# Patient Record
Sex: Male | Born: 1950 | Race: Black or African American | Hispanic: No | Marital: Single | Smoking: Former smoker
Health system: Southern US, Community
[De-identification: ages and names within clinical notes are randomized; demographics above are authoritative.]

## PROBLEM LIST (undated history)

## (undated) DIAGNOSIS — D649 Anemia, unspecified: Secondary | ICD-10-CM

## (undated) DIAGNOSIS — M1990 Unspecified osteoarthritis, unspecified site: Secondary | ICD-10-CM

## (undated) DIAGNOSIS — I509 Heart failure, unspecified: Secondary | ICD-10-CM

## (undated) DIAGNOSIS — I10 Essential (primary) hypertension: Secondary | ICD-10-CM

## (undated) DIAGNOSIS — I499 Cardiac arrhythmia, unspecified: Secondary | ICD-10-CM

## (undated) DIAGNOSIS — R0600 Dyspnea, unspecified: Secondary | ICD-10-CM

## (undated) DIAGNOSIS — N189 Chronic kidney disease, unspecified: Secondary | ICD-10-CM

## (undated) DIAGNOSIS — I4891 Unspecified atrial fibrillation: Secondary | ICD-10-CM

## (undated) HISTORY — PX: ANKLE SURGERY: SHX546

## (undated) HISTORY — PX: FOOT SURGERY: SHX648

## (undated) HISTORY — DX: Heart failure, unspecified: I50.9

## (undated) HISTORY — DX: Chronic kidney disease, unspecified: N18.9

## (undated) HISTORY — PX: OTHER SURGICAL HISTORY: SHX169

## (undated) HISTORY — DX: Unspecified atrial fibrillation: I48.91

## (undated) HISTORY — DX: Essential (primary) hypertension: I10

---

## 2019-08-18 HISTORY — PX: CARDIOVERSION: SHX1299

## 2019-10-17 DIAGNOSIS — I2699 Other pulmonary embolism without acute cor pulmonale: Secondary | ICD-10-CM

## 2019-10-17 HISTORY — DX: Other pulmonary embolism without acute cor pulmonale: I26.99

## 2019-12-22 ENCOUNTER — Ambulatory Visit: Payer: Self-pay | Admitting: Gastroenterology

## 2020-08-02 ENCOUNTER — Other Ambulatory Visit: Payer: Self-pay | Admitting: Student

## 2020-08-02 ENCOUNTER — Other Ambulatory Visit (HOSPITAL_COMMUNITY): Payer: Self-pay | Admitting: Student

## 2020-08-02 DIAGNOSIS — N183 Chronic kidney disease, stage 3 unspecified: Secondary | ICD-10-CM

## 2020-08-02 DIAGNOSIS — I10 Essential (primary) hypertension: Secondary | ICD-10-CM

## 2020-08-08 ENCOUNTER — Encounter (HOSPITAL_COMMUNITY): Payer: Self-pay

## 2020-08-08 ENCOUNTER — Ambulatory Visit (HOSPITAL_COMMUNITY): Payer: Medicare Other | Attending: Student

## 2021-03-06 ENCOUNTER — Encounter: Payer: Self-pay | Admitting: Internal Medicine

## 2021-03-13 ENCOUNTER — Other Ambulatory Visit: Payer: Self-pay | Admitting: Nephrology

## 2021-03-13 ENCOUNTER — Other Ambulatory Visit (HOSPITAL_COMMUNITY): Payer: Self-pay | Admitting: Nephrology

## 2021-03-13 DIAGNOSIS — N170 Acute kidney failure with tubular necrosis: Secondary | ICD-10-CM

## 2021-03-13 DIAGNOSIS — I129 Hypertensive chronic kidney disease with stage 1 through stage 4 chronic kidney disease, or unspecified chronic kidney disease: Secondary | ICD-10-CM

## 2021-03-13 DIAGNOSIS — D638 Anemia in other chronic diseases classified elsewhere: Secondary | ICD-10-CM

## 2021-03-13 DIAGNOSIS — N1831 Chronic kidney disease, stage 3a: Secondary | ICD-10-CM

## 2021-03-13 DIAGNOSIS — R809 Proteinuria, unspecified: Secondary | ICD-10-CM

## 2021-03-19 ENCOUNTER — Ambulatory Visit (HOSPITAL_COMMUNITY)
Admission: RE | Admit: 2021-03-19 | Discharge: 2021-03-19 | Disposition: A | Discharge status: Home / Self Care | Payer: Medicare Other | Source: Ambulatory Visit | Attending: Nephrology | Admitting: Nephrology

## 2021-03-19 ENCOUNTER — Other Ambulatory Visit: Payer: Self-pay

## 2021-03-19 DIAGNOSIS — R809 Proteinuria, unspecified: Secondary | ICD-10-CM | POA: Diagnosis present

## 2021-03-19 DIAGNOSIS — N170 Acute kidney failure with tubular necrosis: Secondary | ICD-10-CM | POA: Diagnosis present

## 2021-03-19 DIAGNOSIS — D638 Anemia in other chronic diseases classified elsewhere: Secondary | ICD-10-CM

## 2021-03-19 DIAGNOSIS — I129 Hypertensive chronic kidney disease with stage 1 through stage 4 chronic kidney disease, or unspecified chronic kidney disease: Secondary | ICD-10-CM | POA: Diagnosis present

## 2021-03-19 DIAGNOSIS — N1831 Chronic kidney disease, stage 3a: Secondary | ICD-10-CM

## 2021-05-12 ENCOUNTER — Encounter: Payer: Self-pay | Admitting: Radiology

## 2021-05-27 ENCOUNTER — Encounter: Payer: Self-pay | Admitting: Orthopaedic Surgery

## 2021-05-27 ENCOUNTER — Ambulatory Visit: Payer: Medicare Other

## 2021-05-27 ENCOUNTER — Other Ambulatory Visit: Payer: Self-pay

## 2021-05-27 ENCOUNTER — Ambulatory Visit (INDEPENDENT_AMBULATORY_CARE_PROVIDER_SITE_OTHER): Payer: Medicare Other | Admitting: Orthopaedic Surgery

## 2021-05-27 VITALS — BP 130/74 | HR 54 | Ht 68.0 in | Wt 163.5 lb

## 2021-05-27 DIAGNOSIS — G8929 Other chronic pain: Secondary | ICD-10-CM

## 2021-05-27 DIAGNOSIS — Z7901 Long term (current) use of anticoagulants: Secondary | ICD-10-CM

## 2021-05-27 DIAGNOSIS — M5441 Lumbago with sciatica, right side: Secondary | ICD-10-CM

## 2021-05-27 DIAGNOSIS — M5442 Lumbago with sciatica, left side: Secondary | ICD-10-CM

## 2021-05-27 NOTE — Progress Notes (Signed)
Subjective:    Patient ID: Mario [name], male    DOB: 1950-12-04, 70 y.o.   MRN: [id number]  HPI He has long history of lower back pain, bilateral hip and bilateral knee pain and deformity of right ankle and gait problems.  He uses a walker.  He is seen her from [facility].  I have X-rays of the hips, pelvis and knees but no notes.  I have reviewed the X-rays sent.  He says his lower back hurts with some radiation down both legs.  He has no trauma.  He has no redness.  He is not weak.  He has more pain in the mornings and when weather is rainy or cold.  He is gradually getting worse.  He lives alone and is accompanied by paramedic from [place].  He takes over the counter medicines. He is on Eliquis because of atrial fib. He has GERD. He cannot take NSAIDs.  X-rays show mild degenerative changes, no fractures.  He needs a walker to walk but can stand on his own.  He has deformity of ankle on the right for years he says.  I have independently reviewed and interpreted x-rays of this patient done at another site by another physician or qualified health professional.   Review of Systems  Constitutional:  Positive for activity change.  Respiratory:  Positive for shortness of breath.   Cardiovascular:  Positive for palpitations.  Endocrine: Positive for cold intolerance.  Musculoskeletal:  Positive for arthralgias, back pain, gait problem, joint swelling and myalgias.  Allergic/Immunologic: Positive for environmental allergies.  All other systems reviewed and are negative. For Review of Systems, all other systems reviewed and are negative.  The following is a summary of the past history medically, past history surgically, known current medicines, social history and family history.  This information is gathered electronically by the computer from prior information and documentation.  I review this each visit and have found including this information at this point in the  chart is beneficial and informative.   No past medical history on file.   Current Outpatient Medications on File Prior to Visit  Medication Sig Dispense Refill   amiodarone (PACERONE) 200 MG tablet Take 200 mg by mouth 2 (two) times daily.     amLODipine (NORVASC) 5 MG tablet Take 5 mg by mouth daily.     ELIQUIS 5 MG TABS tablet Take 5 mg by mouth 2 (two) times daily.     furosemide (LASIX) 20 MG tablet Take 1 tablet by mouth daily.     losartan (COZAAR) 100 MG tablet Take 1 tablet by mouth daily.     metoprolol succinate (TOPROL-XL) 25 MG 24 hr tablet Take 1 tablet by mouth daily.     pantoprazole (PROTONIX) 40 MG tablet Take 1 tablet by mouth daily.     No current facility-administered medications on file prior to visit.    Social History   Socioeconomic History   Marital status: Unknown    Spouse name: Not on file   Number of children: Not on file   Years of education: Not on file   Highest education level: Not on file  Occupational History   Not on file  Tobacco Use   Smoking status: Former    Types: Cigarettes   Smokeless tobacco: Former    Types: Chew  Substance and Sexual Activity   Alcohol use: Not on file   Drug use: Not on file   Sexual activity: Not on file  Other Topics Concern   Not on file  Social History Narrative   Not on file   Social Determinants of Health   Financial Resource Strain: Not on file  Food Insecurity: Not on file  Transportation Needs: Not on file  Physical Activity: Not on file  Stress: Not on file  Social Connections: Not on file  Intimate Partner Violence: Not on file    No family history on file.  BP 130/74   Pulse (!) 54   Ht 5\' 8"  (1.727 m)   Wt 163 lb 8 oz (74.2 kg)   BMI 24.86 kg/m   Body mass index is 24.86 kg/m.     Objective:   Physical Exam Vitals and nursing note reviewed. Exam conducted with a chaperone present.  Constitutional:      Appearance: He is well-developed.  HENT:     Head: Normocephalic  and atraumatic.  Eyes:     Conjunctiva/sclera: Conjunctivae normal.     Pupils: Pupils are equal, round, and reactive to light.  Cardiovascular:     Rate and Rhythm: Normal rate and regular rhythm.  Pulmonary:     Effort: Pulmonary effort is normal.  Abdominal:     Palpations: Abdomen is soft.  Musculoskeletal:       Arms:     Cervical back: Normal range of motion and neck supple.  Skin:    General: Skin is warm and dry.  Neurological:     Mental Status: He is alert and oriented to person, place, and time.     Cranial Nerves: No cranial nerve deficit.     Motor: No abnormal muscle tone.     Coordination: Coordination normal.     Deep Tendon Reflexes: Reflexes are normal and symmetric. Reflexes normal.  Psychiatric:        Behavior: Behavior normal.        Thought Content: Thought content normal.        Judgment: Judgment normal.   X-rays were done of the lumbar spine, reported separately.       Assessment & Plan:   Encounter Diagnoses  Name Primary?   Chronic midline low back pain with bilateral sciatica Yes   Anticoagulated    I will get MRI of the lumbar spine.  Return in three weeks.  He may need brace for ankle on the right but I want to get the MRI first.  Call if any problem.  Precautions discussed.  Electronically Signed , MD 10/11/20222:00 PM

## 2021-06-11 ENCOUNTER — Other Ambulatory Visit: Payer: Self-pay

## 2021-06-11 ENCOUNTER — Ambulatory Visit (HOSPITAL_COMMUNITY)
Admission: RE | Admit: 2021-06-11 | Discharge: 2021-06-11 | Disposition: A | Discharge status: Home / Self Care | Payer: Medicare Other | Source: Ambulatory Visit | Attending: Orthopaedic Surgery | Admitting: Orthopaedic Surgery

## 2021-06-11 DIAGNOSIS — M5442 Lumbago with sciatica, left side: Secondary | ICD-10-CM | POA: Diagnosis present

## 2021-06-11 DIAGNOSIS — M5441 Lumbago with sciatica, right side: Secondary | ICD-10-CM | POA: Diagnosis not present

## 2021-06-11 DIAGNOSIS — G8929 Other chronic pain: Secondary | ICD-10-CM | POA: Diagnosis present

## 2021-06-17 ENCOUNTER — Ambulatory Visit (INDEPENDENT_AMBULATORY_CARE_PROVIDER_SITE_OTHER): Payer: Medicare Other | Admitting: Orthopaedic Surgery

## 2021-06-17 ENCOUNTER — Encounter: Payer: Self-pay | Admitting: Orthopaedic Surgery

## 2021-06-17 ENCOUNTER — Other Ambulatory Visit: Payer: Self-pay

## 2021-06-17 DIAGNOSIS — G8929 Other chronic pain: Secondary | ICD-10-CM

## 2021-06-17 DIAGNOSIS — M5441 Lumbago with sciatica, right side: Secondary | ICD-10-CM

## 2021-06-17 DIAGNOSIS — M5442 Lumbago with sciatica, left side: Secondary | ICD-10-CM

## 2021-06-17 NOTE — Progress Notes (Signed)
My back still hurts  He has more back pain.  He went to get his MRI of the lumbar spine and it showed: IMPRESSION: 1. Diffuse lumbar spine spondylosis as described above most severe at L1-2 and L2-3. 2.  No acute osseous injury of the lumbar spine. 3. At L1-2 there is a broad-based disc bulge with a large left paracentral disc extrusion with mass effect on the intraspinal nerve roots. Mild bilateral facet arthropathy. Severe spinal stenosis. Moderate left and mild right foraminal stenosis. 4. At L2-3 there is a broad-based disc bulge with a large left paracentral disc extrusion with cephalad migration of disc material and mass effect upon the left intraspinal nerve roots. No right foraminal stenosis. Mild left foraminal stenosis. Severe spinal stenosis.  Fredia Beets explained the findings to him.  I will have the neurosurgeon see him.  Patient is agreeable.  Spine/Pelvis examination:  Inspection:  Overall, sacoiliac joint benign and hips nontender; without crepitus or defects.   Thoracic spine inspection: Alignment normal without kyphosis present   Lumbar spine inspection:  Alignment  with normal lumbar lordosis, without scoliosis apparent.   Thoracic spine palpation:  without tenderness of spinal processes   Lumbar spine palpation: without tenderness of lumbar area; without tightness of lumbar muscles    Range of Motion:   Lumbar flexion, forward flexion is normal without pain or tenderness    Lumbar extension is full without pain or tenderness   Left lateral bend is normal without pain or tenderness   Right lateral bend is normal without pain or tenderness   Straight leg raising is normal  Strength & tone: normal   Stability overall normal stability  I have independently reviewed the MRI.      Encounter Diagnosis  Name Primary?   Chronic midline low back pain with bilateral sciatica Yes   To neurosurgery.  Call if any problem.  Precautions  discussed.  Electronically Signed Darreld Mclean, MD 11/1/202210:28 AM

## 2021-07-01 ENCOUNTER — Encounter: Payer: Self-pay | Admitting: *Deleted

## 2021-07-01 ENCOUNTER — Telehealth: Payer: Self-pay | Admitting: Internal Medicine

## 2021-07-01 ENCOUNTER — Telehealth: Payer: Self-pay | Admitting: *Deleted

## 2021-07-01 ENCOUNTER — Other Ambulatory Visit (HOSPITAL_COMMUNITY): Payer: Self-pay | Admitting: Neurosurgery

## 2021-07-01 ENCOUNTER — Other Ambulatory Visit: Payer: Self-pay

## 2021-07-01 ENCOUNTER — Other Ambulatory Visit: Payer: Self-pay | Admitting: Neurosurgery

## 2021-07-01 ENCOUNTER — Ambulatory Visit (INDEPENDENT_AMBULATORY_CARE_PROVIDER_SITE_OTHER): Payer: Medicare Other | Admitting: Gastroenterology

## 2021-07-01 ENCOUNTER — Encounter: Payer: Self-pay | Admitting: Gastroenterology

## 2021-07-01 DIAGNOSIS — M4126 Other idiopathic scoliosis, lumbar region: Secondary | ICD-10-CM

## 2021-07-01 DIAGNOSIS — Z1211 Encounter for screening for malignant neoplasm of colon: Secondary | ICD-10-CM | POA: Insufficient documentation

## 2021-07-01 DIAGNOSIS — D649 Anemia, unspecified: Secondary | ICD-10-CM

## 2021-07-01 DIAGNOSIS — G959 Disease of spinal cord, unspecified: Secondary | ICD-10-CM

## 2021-07-01 MED ORDER — PEG 3350-KCL-NA BICARB-NACL 420 G PO SOLR: ORAL | 0 refills | Status: DC

## 2021-07-01 NOTE — Telephone Encounter (Signed)
[facility] and made aware he can take enema with him to appt.

## 2021-07-01 NOTE — Addendum Note (Signed)
Addended by: Armstead Peaks on: 07/01/2021 09:25 AM   Modules accepted: Orders

## 2021-07-01 NOTE — Patient Instructions (Signed)
We are arranging a colonoscopy and upper endoscopy with Dr. Marletta Lor in the near future.  You will need to stop Eliquis for 2 days before the procedure!  Further recommendations to follow!  It was a pleasure to see you today. I want to create trusting relationships with patients to provide genuine, compassionate, and quality care. I value your feedback. If you receive a survey regarding your visit,  I greatly appreciate you taking time to fill this out.   Gelene Mink, PhD, ANP-BC [facility]

## 2021-07-01 NOTE — H&P (View-Only) (Signed)
Primary Care Physician:  Mario Pali, FNP Referring Provider: Coral Ceo, FNP Primary Gastroenterologist:  Dr. Marletta [name]  Chief Complaint  Patient presents with   Colonoscopy    Never had tcs. No fhcrc    HPI:   Mario [name] is a 70 y.o. male presenting today at the request of Mario Ceo, FNP, for screening colonoscopy. He is on Eliquis for afib. No prior colonoscopy. No family history of colorectal cancer or polyps that he is aware. He is here with a caretaker, a community paramedic.   Patient denies any abdominal pain, N/V, weight loss, lack of appetite, dysphagia, change in bowel habits, constipation, or diarrhea. I do note his Hgb dropped to 7 range in March 2022. He reports a blood transfusion X2 earlier this year in [place]. Records not available. No overt GI bleeding. Patient believes he was heme positive.   Has been on a PPI at least a year. Patient denies any chronic GERD symptoms. Takes OTC meds for arthritis but unable to tell me what. States he was told he had a stomach ulcer in the past but no EGD.   Most recent Hgb 12.5 in Oct 2022. Ferritin 44 in July 2022.   Past Medical History:  Diagnosis Date   Atrial fibrillation (HCC)    CHF (congestive heart failure) (HCC)    CKD (chronic kidney disease)    HTN (hypertension)     Past Surgical History:  Procedure Laterality Date   none      Current Outpatient Medications  Medication Sig Dispense Refill   amiodarone (PACERONE) 200 MG tablet Take 200 mg by mouth 2 (two) times daily.     amLODipine (NORVASC) 5 MG tablet Take 5 mg by mouth daily.     ELIQUIS 5 MG TABS tablet Take 5 mg by mouth 2 (two) times daily.     furosemide (LASIX) 20 MG tablet Take 1 tablet by mouth daily.     losartan (COZAAR) 100 MG tablet Take 1 tablet by mouth daily.     metoprolol succinate (TOPROL-XL) 25 MG 24 hr tablet Take 1 tablet by mouth daily.     pantoprazole (PROTONIX) 40 MG tablet Take 1 tablet by mouth daily.     No  current facility-administered medications for this visit.    Allergies as of 07/01/2021   (No Known Allergies)    Family History  Problem Relation Age of Onset   Cancer Sister        unknown type   Colon cancer Neg Hx    Colon polyps Neg Hx     Social History   Socioeconomic History   Marital status: Unknown    Spouse name: Not on file   Number of children: Not on file   Years of education: Not on file   Highest education level: Not on file  Occupational History   Not on file  Tobacco Use   Smoking status: Former    Types: Cigarettes   Smokeless tobacco: Former    Types: Chew  Substance and Sexual Activity   Alcohol use: Not Currently   Drug use: Not Currently   Sexual activity: Not on file  Other Topics Concern   Not on file  Social History Narrative   Not on file   Social Determinants of Health   Financial Resource Strain: Not on file  Food Insecurity: Not on file  Transportation Needs: Not on file  Physical Activity: Not on file  Stress: Not on file  Social Connections:  Not on file  Intimate Partner Violence: Not on file    Review of Systems: Gen: Denies any fever, chills, fatigue, weight loss, lack of appetite.  CV: Denies chest pain, heart palpitations, peripheral edema, syncope.  Resp: Denies shortness of breath at rest or with exertion. Denies wheezing or cough.  GI: see HPI GU : Denies urinary burning, urinary frequency, urinary hesitancy MS: chronic joint pain, back pain, right deformed ankle, uses walker for ambulation Derm: Denies rash, itching, dry skin Psych: Denies depression, anxiety, memory loss, and confusion Heme: Denies bruising, bleeding, and enlarged lymph nodes.  Physical Exam: BP 122/72   Pulse 64   Temp (!) 96.8 F (36 C) (Temporal)   Ht 5\' 8"  (1.727 m)   Wt 158 lb 9.6 oz (71.9 kg)   BMI 24.12 kg/m  General:   Alert and oriented. Pleasant and cooperative. Well-nourished and well-developed.  Head:  Normocephalic and  atraumatic. Eyes:  Without icterus Ears:  Normal auditory acuity. Mouth:  edentulous Lungs:  Clear to auscultation bilaterally.  Heart:  S1, S2 present without murmurs appreciated.  Abdomen:  +BS, soft, non-tender and non-distended. No HSM noted. No guarding or rebound. No masses appreciated.  Rectal:  Deferred  Msk:  right ankle deformity, uses walker for assistance, able to ambulate without assistance Extremities:  Without edema. Neurologic:  Alert and  oriented x4 Psych:  Alert and cooperative. Normal mood and affect.  ASSESSMENT: Mario [name] is a 70 y.o. male presenting today at the request of 61, FNP, for initial screening colonoscopy. No family history of colorectal cancer or polyps.  Upon review of chart, he has anemia of chronic disease and an iron deficiency component. He does note that he received two blood transfusions earlier this year in [place], but no endoscopic evaluations were undertaken. Hgb on file down to 7 in March of this year and now most recently 12.5. He has no overt GI bleeding.  On Eliquis chronically for afib. In setting of anticoagulation, could have occult blood loss anywhere in GI tract. He also reports taking OTC agents for joint pain, but he is unable to clarify if acetaminophen or NSAIDs.    Needs colonoscopy/EGD in near future to evaluate GI tract. Anemia improved at this time and without any overt GI bleeding.  Will hold Eliquis X 48 hours prior. Continue PPI.   PLAN:  Proceed with colonoscopy/EGD by Dr. April  in near future: the risks, benefits, and alternatives have been discussed with the patient in detail. The patient states understanding and desires to proceed.   Hold Eliquis X 48 hours prior  Further recommendations to follow.  Mario Lor, PhD, ANP-BC [facility]

## 2021-07-01 NOTE — Telephone Encounter (Signed)
Pre-op scheduled for 12/1 at 11:30am. [facility] and she is aware of pre-op appt details.

## 2021-07-01 NOTE — Progress Notes (Signed)
Primary Care Physician:  Wilmon Pali, FNP Referring Provider: Coral Ceo, FNP Primary Gastroenterologist:  Dr. Marletta Lor  Chief Complaint  Patient presents with   Colonoscopy    Never had tcs. No fhcrc    HPI:   Mario [name] is a 70 y.o. male presenting today at the request of Coral Ceo, FNP, for screening colonoscopy. He is on Eliquis for afib. No prior colonoscopy. No family history of colorectal cancer or polyps that he is aware. He is here with a caretaker, a community paramedic.   Patient denies any abdominal pain, N/V, weight loss, lack of appetite, dysphagia, change in bowel habits, constipation, or diarrhea. I do note his Hgb dropped to 7 range in March 2022. He reports a blood transfusion X2 earlier this year in [place]. Records not available. No overt GI bleeding. Patient believes he was heme positive.   Has been on a PPI at least a year. Patient denies any chronic GERD symptoms. Takes OTC meds for arthritis but unable to tell me what. States he was told he had a stomach ulcer in the past but no EGD.   Most recent Hgb 12.5 in Oct 2022. Ferritin 44 in July 2022.   Past Medical History:  Diagnosis Date   Atrial fibrillation (HCC)    CHF (congestive heart failure) (HCC)    CKD (chronic kidney disease)    HTN (hypertension)     Past Surgical History:  Procedure Laterality Date   none      Current Outpatient Medications  Medication Sig Dispense Refill   amiodarone (PACERONE) 200 MG tablet Take 200 mg by mouth 2 (two) times daily.     amLODipine (NORVASC) 5 MG tablet Take 5 mg by mouth daily.     ELIQUIS 5 MG TABS tablet Take 5 mg by mouth 2 (two) times daily.     furosemide (LASIX) 20 MG tablet Take 1 tablet by mouth daily.     losartan (COZAAR) 100 MG tablet Take 1 tablet by mouth daily.     metoprolol succinate (TOPROL-XL) 25 MG 24 hr tablet Take 1 tablet by mouth daily.     pantoprazole (PROTONIX) 40 MG tablet Take 1 tablet by mouth daily.     No  current facility-administered medications for this visit.    Allergies as of 07/01/2021   (No Known Allergies)    Family History  Problem Relation Age of Onset   Cancer Sister        unknown type   Colon cancer Neg Hx    Colon polyps Neg Hx     Social History   Socioeconomic History   Marital status: Unknown    Spouse name: Not on file   Number of children: Not on file   Years of education: Not on file   Highest education level: Not on file  Occupational History   Not on file  Tobacco Use   Smoking status: Former    Types: Cigarettes   Smokeless tobacco: Former    Types: Chew  Substance and Sexual Activity   Alcohol use: Not Currently   Drug use: Not Currently   Sexual activity: Not on file  Other Topics Concern   Not on file  Social History Narrative   Not on file   Social Determinants of Health   Financial Resource Strain: Not on file  Food Insecurity: Not on file  Transportation Needs: Not on file  Physical Activity: Not on file  Stress: Not on file  Social Connections:  Not on file  Intimate Partner Violence: Not on file    Review of Systems: Gen: Denies any fever, chills, fatigue, weight loss, lack of appetite.  CV: Denies chest pain, heart palpitations, peripheral edema, syncope.  Resp: Denies shortness of breath at rest or with exertion. Denies wheezing or cough.  GI: see HPI GU : Denies urinary burning, urinary frequency, urinary hesitancy MS: chronic joint pain, back pain, right deformed ankle, uses walker for ambulation Derm: Denies rash, itching, dry skin Psych: Denies depression, anxiety, memory loss, and confusion Heme: Denies bruising, bleeding, and enlarged lymph nodes.  Physical Exam: BP 122/72   Pulse 64   Temp (!) 96.8 F (36 C) (Temporal)   Ht 5\' 8"  (1.727 m)   Wt 158 lb 9.6 oz (71.9 kg)   BMI 24.12 kg/m  General:   Alert and oriented. Pleasant and cooperative. Well-nourished and well-developed.  Head:  Normocephalic and  atraumatic. Eyes:  Without icterus Ears:  Normal auditory acuity. Mouth:  edentulous Lungs:  Clear to auscultation bilaterally.  Heart:  S1, S2 present without murmurs appreciated.  Abdomen:  +BS, soft, non-tender and non-distended. No HSM noted. No guarding or rebound. No masses appreciated.  Rectal:  Deferred  Msk:  right ankle deformity, uses walker for assistance, able to ambulate without assistance Extremities:  Without edema. Neurologic:  Alert and  oriented x4 Psych:  Alert and cooperative. Normal mood and affect.  ASSESSMENT: Mario [name] is a 70 y.o. male presenting today at the request of 61, FNP, for initial screening colonoscopy. No family history of colorectal cancer or polyps.  Upon review of chart, he has anemia of chronic disease and an iron deficiency component. He does note that he received two blood transfusions earlier this year in [place], but no endoscopic evaluations were undertaken. Hgb on file down to 7 in March of this year and now most recently 12.5. He has no overt GI bleeding.  On Eliquis chronically for afib. In setting of anticoagulation, could have occult blood loss anywhere in GI tract. He also reports taking OTC agents for joint pain, but he is unable to clarify if acetaminophen or NSAIDs.    Needs colonoscopy/EGD in near future to evaluate GI tract. Anemia improved at this time and without any overt GI bleeding.  Will hold Eliquis X 48 hours prior. Continue PPI.   PLAN:  Proceed with colonoscopy/EGD by Dr. April  in near future: the risks, benefits, and alternatives have been discussed with the patient in detail. The patient states understanding and desires to proceed.   Hold Eliquis X 48 hours prior  Further recommendations to follow.  Marletta Lor, PhD, ANP-BC [facility]

## 2021-07-01 NOTE — Telephone Encounter (Signed)
Mario [name] [phone] PLEASE CALL. PATIENT WILL NOT BE ABLE TO DO AN ENEMA

## 2021-07-15 ENCOUNTER — Encounter (HOSPITAL_COMMUNITY): Payer: Self-pay

## 2021-07-15 ENCOUNTER — Other Ambulatory Visit: Payer: Self-pay | Admitting: Neurosurgery

## 2021-07-15 ENCOUNTER — Other Ambulatory Visit: Payer: Self-pay

## 2021-07-15 ENCOUNTER — Ambulatory Visit (HOSPITAL_COMMUNITY)
Admission: RE | Admit: 2021-07-15 | Discharge: 2021-07-15 | Disposition: A | Discharge status: Home / Self Care | Payer: Medicare Other | Source: Ambulatory Visit | Attending: Neurosurgery | Admitting: Neurosurgery

## 2021-07-15 ENCOUNTER — Other Ambulatory Visit (HOSPITAL_COMMUNITY): Payer: Self-pay | Admitting: Neurosurgery

## 2021-07-15 DIAGNOSIS — M4126 Other idiopathic scoliosis, lumbar region: Secondary | ICD-10-CM

## 2021-07-15 DIAGNOSIS — G959 Disease of spinal cord, unspecified: Secondary | ICD-10-CM | POA: Insufficient documentation

## 2021-07-17 ENCOUNTER — Encounter (HOSPITAL_COMMUNITY): Payer: Self-pay

## 2021-07-17 ENCOUNTER — Other Ambulatory Visit: Payer: Self-pay

## 2021-07-17 ENCOUNTER — Encounter (HOSPITAL_COMMUNITY)
Admission: RE | Admit: 2021-07-17 | Discharge: 2021-07-17 | Disposition: A | Discharge status: Home / Self Care | Payer: Medicare Other | Source: Ambulatory Visit | Attending: Internal Medicine | Admitting: Internal Medicine

## 2021-07-17 VITALS — BP 139/80 | HR 57 | Temp 97.8°F | Resp 18 | Ht 68.0 in | Wt 158.0 lb

## 2021-07-17 DIAGNOSIS — Z01818 Encounter for other preprocedural examination: Secondary | ICD-10-CM | POA: Insufficient documentation

## 2021-07-17 DIAGNOSIS — N189 Chronic kidney disease, unspecified: Secondary | ICD-10-CM | POA: Diagnosis not present

## 2021-07-17 DIAGNOSIS — D649 Anemia, unspecified: Secondary | ICD-10-CM | POA: Diagnosis not present

## 2021-07-17 HISTORY — DX: Unspecified osteoarthritis, unspecified site: M19.90

## 2021-07-17 HISTORY — DX: Cardiac arrhythmia, unspecified: I49.9

## 2021-07-17 HISTORY — DX: Anemia, unspecified: D64.9

## 2021-07-17 LAB — BASIC METABOLIC PANEL
Anion gap: 8 (ref 5–15)
BUN: 20 mg/dL (ref 8–23)
CO2: 24 mmol/L (ref 22–32)
Calcium: 9 mg/dL (ref 8.9–10.3)
Chloride: 109 mmol/L (ref 98–111)
Creatinine, Ser: 1.41 mg/dL — ABNORMAL HIGH (ref 0.61–1.24)
GFR, Estimated: 53 mL/min — ABNORMAL LOW (ref >60)
Glucose, Bld: 96 mg/dL (ref 70–99)
Potassium: 4 mmol/L (ref 3.5–5.1)
Sodium: 141 mmol/L (ref 135–145)

## 2021-07-17 LAB — CBC WITH DIFFERENTIAL/PLATELET
Abs Immature Granulocytes: 0.03 10*3/uL (ref 0.00–0.07)
Basophils Absolute: 0 10*3/uL (ref 0.0–0.1)
Basophils Relative: 1 %
Eosinophils Absolute: 0 10*3/uL (ref 0.0–0.5)
Eosinophils Relative: 1 %
HCT: 34.2 % — ABNORMAL LOW (ref 39.0–52.0)
Hemoglobin: 11 g/dL — ABNORMAL LOW (ref 13.0–17.0)
Immature Granulocytes: 1 %
Lymphocytes Relative: 9 %
Lymphs Abs: 0.6 10*3/uL — ABNORMAL LOW (ref 0.7–4.0)
MCH: 27.4 pg (ref 26.0–34.0)
MCHC: 32.2 g/dL (ref 30.0–36.0)
MCV: 85.1 fL (ref 80.0–100.0)
Monocytes Absolute: 0.6 10*3/uL (ref 0.1–1.0)
Monocytes Relative: 8 %
Neutro Abs: 5.4 10*3/uL (ref 1.7–7.7)
Neutrophils Relative %: 80 %
Platelets: 320 10*3/uL (ref 150–400)
RBC: 4.02 MIL/uL — ABNORMAL LOW (ref 4.22–5.81)
RDW: 14.4 % (ref 11.5–15.5)
WBC: 6.6 10*3/uL (ref 4.0–10.5)
nRBC: 0 % (ref 0.0–0.2)

## 2021-07-17 NOTE — Patient Instructions (Signed)
Mario [name]  07/17/2021     @PREFPERIOPPHARMACY @   Your procedure is scheduled on  07/21/2021.   Report to 14/12/2020 at  0730 A.M.   Call this number if you have problems the morning of surgery:  [phone]   Remember:  Follow the diet and prep instructions given to you by the office.    You last dose of eliquis should be 07/18/2021.    Take these medicines the morning of surgery with A SIP OF WATER       amiodarone, amlodipine, proscar, metoprolol, protonix, flomax.    Do not wear jewelry, make-up or nail polish.  Do not wear lotions, powders, or perfumes, or deodorant.  Do not shave 48 hours prior to surgery.  Men may shave face and neck.  Do not bring valuables to the hospital.  [facility] is not responsible for any belongings or valuables.  Contacts, dentures or bridgework may not be worn into surgery.  Leave your suitcase in the car.  After surgery it may be brought to your room.  For patients admitted to the hospital, discharge time will be determined by your treatment team.  Patients discharged the day of surgery will not be allowed to drive home and must have someone with them for 24 hours.    Special instructions:   DO NOT smoke tobacco or vape for 24 hours before your procedure.  Please read over the following fact sheets that you were given. Anesthesia Post-op Instructions and Care and Recovery After Surgery      Upper Endoscopy, Adult, Care After This sheet gives you information about how to care for yourself after your procedure. Your health care provider may also give you more specific instructions. If you have problems or questions, contact your health care provider. What can I expect after the procedure? After the procedure, it is common to have: A sore throat. Mild stomach pain or discomfort. Bloating. Nausea. Follow these instructions at home:  Follow instructions from your health care provider about what to eat or drink  after your procedure. Return to your normal activities as told by your health care provider. Ask your health care provider what activities are safe for you. Take over-the-counter and prescription medicines only as told by your health care provider. If you were given a sedative during the procedure, it can affect you for several hours. Do not drive or operate machinery until your health care provider says that it is safe. Keep all follow-up visits as told by your health care provider. This is important. Contact a health care provider if you have: A sore throat that lasts longer than one day. Trouble swallowing. Get help right away if: You vomit blood or your vomit looks like coffee grounds. You have: A fever. Bloody, black, or tarry stools. A severe sore throat or you cannot swallow. Difficulty breathing. Severe pain in your chest or abdomen. Summary After the procedure, it is common to have a sore throat, mild stomach discomfort, bloating, and nausea. If you were given a sedative during the procedure, it can affect you for several hours. Do not drive or operate machinery until your health care provider says that it is safe. Follow instructions from your health care provider about what to eat or drink after your procedure. Return to your normal activities as told by your health care provider. This information is not intended to replace advice given to you by your health care provider. Make sure  you discuss any questions you have with your health care provider. Document Revised: 06/09/2019 Document Reviewed: 01/03/2018 Elsevier Patient Education  2022 [employer]. Colonoscopy, Adult, Care After This sheet gives you information about how to care for yourself after your procedure. Your health care provider may also give you more specific instructions. If you have problems or questions, contact your health care provider. What can I expect after the procedure? After the procedure, it is common  to have: A small amount of blood in your stool for 24 hours after the procedure. Some gas. Mild cramping or bloating of your abdomen. Follow these instructions at home: Eating and drinking  Drink enough fluid to keep your urine pale yellow. Follow instructions from your health care provider about eating or drinking restrictions. Resume your normal diet as instructed by your health care provider. Avoid heavy or fried foods that are hard to digest. Activity Rest as told by your health care provider. Avoid sitting for a long time without moving. Get up to take short walks every 1-2 hours. This is important to improve blood flow and breathing. Ask for help if you feel weak or unsteady. Return to your normal activities as told by your health care provider. Ask your health care provider what activities are safe for you. Managing cramping and bloating  Try walking around when you have cramps or feel bloated. Apply heat to your abdomen as told by your health care provider. Use the heat source that your health care provider recommends, such as a moist heat pack or a heating pad. Place a towel between your skin and the heat source. Leave the heat on for 20-30 minutes. Remove the heat if your skin turns bright red. This is especially important if you are unable to feel pain, heat, or cold. You may have a greater risk of getting burned. General instructions If you were given a sedative during the procedure, it can affect you for several hours. Do not drive or operate machinery until your health care provider says that it is safe. For the first 24 hours after the procedure: Do not sign important documents. Do not drink alcohol. Do your regular daily activities at a slower pace than normal. Eat soft foods that are easy to digest. Take over-the-counter and prescription medicines only as told by your health care provider. Keep all follow-up visits as told by your health care provider. This is  important. Contact a health care provider if: You have blood in your stool 2-3 days after the procedure. Get help right away if you have: More than a small spotting of blood in your stool. Large blood clots in your stool. Swelling of your abdomen. Nausea or vomiting. A fever. Increasing pain in your abdomen that is not relieved with medicine. Summary After the procedure, it is common to have a small amount of blood in your stool. You may also have mild cramping and bloating of your abdomen. If you were given a sedative during the procedure, it can affect you for several hours. Do not drive or operate machinery until your health care provider says that it is safe. Get help right away if you have a lot of blood in your stool, nausea or vomiting, a fever, or increased pain in your abdomen. This information is not intended to replace advice given to you by your health care provider. Make sure you discuss any questions you have with your health care provider. Document Revised: 06/09/2019 Document Reviewed: 02/27/2019 Elsevier Patient Education  2022  Else[employer]. Monitored Anesthesia Care, Care After This sheet gives you information about how to care for yourself after your procedure. Your health care provider may also give you more specific instructions. If you have problems or questions, contact your health care provider. What can I expect after the procedure? After the procedure, it is common to have: Tiredness. Forgetfulness about what happened after the procedure. Impaired judgment for important decisions. Nausea or vomiting. Some difficulty with balance. Follow these instructions at home: For the time period you were told by your health care provider:   Rest as needed. Do not participate in activities where you could fall or become injured. Do not drive or use machinery. Do not drink alcohol. Do not take sleeping pills or medicines that cause drowsiness. Do not make important  decisions or sign legal documents. Do not take care of children on your own. Eating and drinking Follow the diet that is recommended by your health care provider. Drink enough fluid to keep your urine pale yellow. If you vomit: Drink water, juice, or soup when you can drink without vomiting. Make sure you have little or no nausea before eating solid foods. General instructions Have a responsible adult stay with you for the time you are told. It is important to have someone help care for you until you are awake and alert. Take over-the-counter and prescription medicines only as told by your health care provider. If you have sleep apnea, surgery and certain medicines can increase your risk for breathing problems. Follow instructions from your health care provider about wearing your sleep device: Anytime you are sleeping, including during daytime naps. While taking prescription pain medicines, sleeping medicines, or medicines that make you drowsy. Avoid smoking. Keep all follow-up visits as told by your health care provider. This is important. Contact a health care provider if: You keep feeling nauseous or you keep vomiting. You feel light-headed. You are still sleepy or having trouble with balance after 24 hours. You develop a rash. You have a fever. You have redness or swelling around the IV site. Get help right away if: You have trouble breathing. You have new-onset confusion at home. Summary For several hours after your procedure, you may feel tired. You may also be forgetful and have poor judgment. Have a responsible adult stay with you for the time you are told. It is important to have someone help care for you until you are awake and alert. Rest as told. Do not drive or operate machinery. Do not drink alcohol or take sleeping pills. Get help right away if you have trouble breathing, or if you suddenly become confused. This information is not intended to replace advice given to you  by your health care provider. Make sure you discuss any questions you have with your health care provider. Document Revised: 04/18/2020 Document Reviewed: 07/06/2019 Elsevier Patient Education  2022 [employer].

## 2021-07-21 ENCOUNTER — Encounter (HOSPITAL_COMMUNITY)
Admission: RE | Disposition: A | Discharge status: Home / Self Care | Payer: Self-pay | Source: Home / Self Care | Attending: Internal Medicine

## 2021-07-21 ENCOUNTER — Encounter (HOSPITAL_COMMUNITY): Payer: Self-pay

## 2021-07-21 ENCOUNTER — Ambulatory Visit (HOSPITAL_COMMUNITY): Payer: Medicare Other | Admitting: Anesthesiology

## 2021-07-21 ENCOUNTER — Encounter: Payer: Self-pay | Admitting: Internal Medicine

## 2021-07-21 ENCOUNTER — Telehealth: Payer: Self-pay | Admitting: Internal Medicine

## 2021-07-21 ENCOUNTER — Ambulatory Visit (HOSPITAL_COMMUNITY)
Admission: RE | Admit: 2021-07-21 | Discharge: 2021-07-21 | Disposition: A | Discharge status: Home / Self Care | Payer: Medicare Other | Attending: Internal Medicine | Admitting: Internal Medicine

## 2021-07-21 DIAGNOSIS — I4891 Unspecified atrial fibrillation: Secondary | ICD-10-CM | POA: Diagnosis not present

## 2021-07-21 DIAGNOSIS — K648 Other hemorrhoids: Secondary | ICD-10-CM | POA: Diagnosis not present

## 2021-07-21 DIAGNOSIS — K317 Polyp of stomach and duodenum: Secondary | ICD-10-CM

## 2021-07-21 DIAGNOSIS — Z538 Procedure and treatment not carried out for other reasons: Secondary | ICD-10-CM | POA: Diagnosis not present

## 2021-07-21 DIAGNOSIS — D509 Iron deficiency anemia, unspecified: Secondary | ICD-10-CM | POA: Diagnosis present

## 2021-07-21 DIAGNOSIS — Z7901 Long term (current) use of anticoagulants: Secondary | ICD-10-CM | POA: Insufficient documentation

## 2021-07-21 DIAGNOSIS — Z87891 Personal history of nicotine dependence: Secondary | ICD-10-CM | POA: Insufficient documentation

## 2021-07-21 HISTORY — PX: COLONOSCOPY WITH PROPOFOL: SHX5780

## 2021-07-21 HISTORY — PX: ESOPHAGOGASTRODUODENOSCOPY (EGD) WITH PROPOFOL: SHX5813

## 2021-07-21 HISTORY — PX: POLYPECTOMY: SHX5525

## 2021-07-21 SURGERY — COLONOSCOPY WITH PROPOFOL
Anesthesia: General

## 2021-07-21 MED ORDER — PROPOFOL 500 MG/50ML IV EMUL
INTRAVENOUS | Status: DC | PRN
  Administered 2021-07-21: 150 ug/kg/min via INTRAVENOUS

## 2021-07-21 MED ORDER — LIDOCAINE HCL (CARDIAC) PF 100 MG/5ML IV SOSY
PREFILLED_SYRINGE | INTRAVENOUS | Status: DC | PRN
  Administered 2021-07-21: 50 mg via INTRAVENOUS

## 2021-07-21 MED ORDER — LACTATED RINGERS IV SOLN: INTRAVENOUS | Status: DC

## 2021-07-21 MED ORDER — PROPOFOL 10 MG/ML IV BOLUS
INTRAVENOUS | Status: DC | PRN
  Administered 2021-07-21: 100 mg via INTRAVENOUS

## 2021-07-21 NOTE — Telephone Encounter (Signed)
Good morning, just completed colonoscopy on this patient.  He was not adequately prepped today.  He will need repeat colonoscopy in 3 to 6 months with extended prep and 2 days of clears.  EGD completed today.  Can we set this up?  Thank you

## 2021-07-21 NOTE — Anesthesia Postprocedure Evaluation (Signed)
Anesthesia Post Note  Patient: Odel Schmid  Procedure(s) Performed: COLONOSCOPY WITH PROPOFOL ESOPHAGOGASTRODUODENOSCOPY (EGD) WITH PROPOFOL POLYPECTOMY  Patient location during evaluation: PACU Anesthesia Type: General Level of consciousness: awake and alert Pain management: pain level controlled Vital Signs Assessment: post-procedure vital signs reviewed and stable Respiratory status: spontaneous breathing, nonlabored ventilation and respiratory function stable Cardiovascular status: blood pressure returned to baseline and stable Postop Assessment: no apparent nausea or vomiting Anesthetic complications: no   No notable events documented.   Last Vitals:  Vitals:   07/21/21 0734 07/21/21 0852  BP: (!) 144/84 124/77  Pulse: (!) 53 (!) 50  Resp: 16 12  Temp: 36.9 C 36.7 C  SpO2: 98% 100%    Last Pain:  Vitals:   07/21/21 0852  TempSrc: Axillary  PainSc: 0-No pain                 Kallista Pae C Loreen Bankson

## 2021-07-21 NOTE — Interval H&P Note (Signed)
History and Physical Interval Note:  07/21/2021 8:16 AM  Mario [name]  has presented today for surgery, with the diagnosis of anemia.  The various methods of treatment have been discussed with the patient and family. After consideration of risks, benefits and other options for treatment, the patient has consented to  Procedure(s) with comments: COLONOSCOPY WITH PROPOFOL (N/A) - 9:30am ESOPHAGOGASTRODUODENOSCOPY (EGD) WITH PROPOFOL (N/A) as a surgical intervention.  The patient's history has been reviewed, patient examined, no change in status, stable for surgery.  I have reviewed the patient's chart and labs.  Questions were answered to the patient's satisfaction.     Lanelle Bal

## 2021-07-21 NOTE — Anesthesia Preprocedure Evaluation (Addendum)
Anesthesia Evaluation  Patient identified by MRN, date of birth, ID band Patient awake    Reviewed: Allergy & Precautions, NPO status , Patient's Chart, lab work & pertinent test results  Airway Mallampati: II  TM Distance: >3 FB Neck ROM: Full    Dental  (+) Edentulous Upper, Edentulous Lower, Dental Advisory Given   Pulmonary former smoker,    Pulmonary exam normal breath sounds clear to auscultation       Cardiovascular Exercise Tolerance: Good hypertension, Pt. on medications +CHF  + dysrhythmias Atrial Fibrillation  Rhythm:Regular Rate:Bradycardia  17-Jul-2021 12:02:05 [facility]-AP-300 ROUTINE RECORD 02-14-1951 (70 yr) Male Black Vent. rate 56 BPM PR interval 154 ms QRS duration 80 ms QT/QTcB 466/449 ms P-R-T axes 70 55 65 Sinus bradycardia Nonspecific T wave abnormality Abnormal ECG Confirmed by Carylon Perches [phone]) on 07/18/2021 5:48:17 PM   Neuro/Psych negative neurological ROS  negative psych ROS   GI/Hepatic negative GI ROS, Neg liver ROS,   Endo/Other    Renal/GU Renal InsufficiencyRenal disease     Musculoskeletal  (+) Arthritis , Osteoarthritis,    Abdominal   Peds  Hematology  (+) anemia ,   Anesthesia Other Findings   Reproductive/Obstetrics                           Anesthesia Physical Anesthesia Plan  ASA: 3  Anesthesia Plan: General   Post-op Pain Management: Minimal or no pain anticipated   Induction: Intravenous  PONV Risk Score and Plan: TIVA  Airway Management Planned: Nasal Cannula and Natural Airway  Additional Equipment:   Intra-op Plan:   Post-operative Plan:   Informed Consent: I have reviewed the patients History and Physical, chart, labs and discussed the procedure including the risks, benefits and alternatives for the proposed anesthesia with the patient or authorized representative who has indicated his/her understanding and  acceptance.       Plan Discussed with: CRNA and Surgeon  Anesthesia Plan Comments:        Anesthesia Quick Evaluation

## 2021-07-21 NOTE — Op Note (Signed)
[facility] Patient Name: Mario [name] Procedure Date: 07/21/2021 8:38 AM MRN: [id number] Date of Birth: September 16, 1950 Attending MD: Elon Alas. Abbey Chatters DO CSN: [id number] Age: 70 Admit Type: Outpatient Procedure:                Colonoscopy Indications:              Iron deficiency anemia Providers:                Elon Alas. Abbey Chatters, DO, Charlsie Quest. Theda Sers RN, RN,                            Nelma Rothman, Technician Referring MD:              Medicines:                See the Anesthesia note for documentation of the                            administered medications Complications:            No immediate complications. Estimated Blood Loss:     Estimated blood loss: none. Procedure:                Pre-Anesthesia Assessment:                           - The anesthesia plan was to use monitored                            anesthesia care (MAC).                           After obtaining informed consent, the colonoscope                            was passed under direct vision. Throughout the                            procedure, the patient's blood pressure, pulse, and                            oxygen saturations were monitored continuously. The                            [id number]-HQ190L ([id number]) scope was introduced through                            the anus with the intention of advancing to the                            cecum. The scope was advanced to the ascending                            colon before the procedure was aborted. Medications                            were given. The colonoscopy was  performed without                            difficulty. The patient tolerated the procedure                            well. The quality of the bowel preparation was                            evaluated using the BBPS [facility] Bowel Preparation                            Scale) with scores of: Right Colon = 0 (unprepared,                            mucosa not seen due to solid stool  that cannot be                            cleared or unseen proximal colon segment in a                            colonoscopy aborted due to inadequate bowel prep),                            Transverse Colon = 1 (portion of mucosa seen, but                            other areas not well seen due to staining, residual                            stool and/or opaque liquid) and Left Colon = 2                            (minor amount of residual staining, small fragments                            of stool and/or opaque liquid, but mucosa seen                            well). The total BBPS score equals 3. The quality                            of the bowel preparation was inadequate. Scope In: 8:40:20 AM Scope Out: 8:46:21 AM Total Procedure Duration: 0 hours 6 minutes 1 second  Findings:      The perianal and digital rectal examinations were normal.      Non-bleeding internal hemorrhoids were found during endoscopy.      Extensive amounts of solid stool was found in the transverse colon and       in the ascending colon, precluding visualization. Impression:               - Preparation of the colon was inadequate.                           -  Non-bleeding internal hemorrhoids.                           - Stool in the transverse colon and in the                            ascending colon.                           - No specimens collected. Moderate Sedation:      Per Anesthesia Care Recommendation:           - Patient has a contact number available for                            emergencies. The signs and symptoms of potential                            delayed complications were discussed with the                            patient. Return to normal activities tomorrow.                            Written discharge instructions were provided to the                            patient.                           - Resume previous diet.                           - Continue present  medications.                           - Repeat colonoscopy in 3-6 months because the                            bowel preparation was poor. Will need extended prep                            and clear liquids x2 days                           - Return to GI clinic after studies are complete. Procedure Code(s):        --- Professional ---                           [phone], 67, Colonoscopy, flexible; diagnostic,                            including collection of specimen(s) by brushing or                            washing, when performed (separate procedure) Diagnosis Code(s):        ---  Professional ---                           K64.8, Other hemorrhoids                           D50.9, Iron deficiency anemia, unspecified CPT copyright 2019 American Medical Association. All rights reserved. The codes documented in this report are preliminary and upon coder review may  be revised to meet current compliance requirements. Elon Alas. Abbey Chatters, DO [place] Abbey Chatters, DO 07/21/2021 8:49:22 AM This report has been signed electronically. Number of Addenda: 0

## 2021-07-21 NOTE — Op Note (Signed)
[facility] Patient Name: Mario [name] Procedure Date: 07/21/2021 8:14 AM MRN: [id number] Date of Birth: 22-Sep-1950 Attending MD: Elon Alas. Abbey Chatters DO CSN: [id number] Age: 70 Admit Type: Outpatient Procedure:                Upper GI endoscopy Indications:              Iron deficiency anemia Providers:                Elon Alas. Abbey Chatters, DO, Charlsie Quest. Theda Sers RN, RN,                            Nelma Rothman, Technician Referring MD:              Medicines:                See the Anesthesia note for documentation of the                            administered medications Complications:            No immediate complications. Estimated Blood Loss:     Estimated blood loss was minimal. Procedure:                Pre-Anesthesia Assessment:                           - The anesthesia plan was to use monitored                            anesthesia care (MAC).                           After obtaining informed consent, the endoscope was                            passed under direct vision. Throughout the                            procedure, the patient's blood pressure, pulse, and                            oxygen saturations were monitored continuously. The                            [id number]-H190 ([id number]) scope was introduced through the                            mouth, and advanced to the second part of duodenum.                            The upper GI endoscopy was accomplished without                            difficulty. The patient tolerated the procedure                            well. Scope In:  8:29:23 AM Scope Out: 8:34:58 AM Total Procedure Duration: 0 hours 5 minutes 35 seconds  Findings:      There is no endoscopic evidence of bleeding, areas of erosion,       inflammation, ulcerations or varices in the entire esophagus.      One 4 mm sessile polyp with no bleeding and no stigmata of recent       bleeding was found in the gastric antrum. The polyp was removed with a        cold snare. Resection and retrieval were complete.      The duodenal bulb, first portion of the duodenum and second portion of       the duodenum were normal. Impression:               - One gastric polyp. Resected and retrieved.                           - Normal duodenal bulb, first portion of the                            duodenum and second portion of the duodenum. Moderate Sedation:      Per Anesthesia Care Recommendation:           - Patient has a contact number available for                            emergencies. The signs and symptoms of potential                            delayed complications were discussed with the                            patient. Return to normal activities tomorrow.                            Written discharge instructions were provided to the                            patient.                           - Resume previous diet.                           - Continue present medications.                           - Await pathology results.                           - Use Protonix (pantoprazole) 40 mg PO daily. Procedure Code(s):        --- Professional ---                           [phone], Esophagogastroduodenoscopy, flexible,                            transoral; with removal  of tumor(s), polyp(s), or                            other lesion(s) by snare technique Diagnosis Code(s):        --- Professional ---                           K31.7, Polyp of stomach and duodenum                           D50.9, Iron deficiency anemia, unspecified CPT copyright 2019 American Medical Association. All rights reserved. The codes documented in this report are preliminary and upon coder review may  be revised to meet current compliance requirements. Elon Alas. Abbey Chatters, DO [place] Abbey Chatters, DO 07/21/2021 8:37:46 AM This report has been signed electronically. Number of Addenda: 0

## 2021-07-21 NOTE — Progress Notes (Signed)
PT waiting on public transportation with caregiver at bedside

## 2021-07-21 NOTE — Transfer of Care (Signed)
Immediate Anesthesia Transfer of Care Note  Patient: Mario [name]  Procedure(s) Performed: COLONOSCOPY WITH PROPOFOL ESOPHAGOGASTRODUODENOSCOPY (EGD) WITH PROPOFOL POLYPECTOMY  Patient Location: Short Stay  Anesthesia Type:General  Level of Consciousness: drowsy  Airway & Oxygen Therapy: Patient Spontanous Breathing  Post-op Assessment: Report given to RN and Post -op Vital signs reviewed and stable  Post vital signs: Reviewed and stable  Last Vitals:  Vitals Value Taken Time  BP    Temp    Pulse    Resp    SpO2      Last Pain:  Vitals:   07/21/21 0825  TempSrc:   PainSc: 0-No pain         Complications: No notable events documented.

## 2021-07-21 NOTE — Telephone Encounter (Signed)
Stacey please schedule OV in 3-6 months to reschedule colonoscopy d/t inadequate prep.

## 2021-07-21 NOTE — Discharge Instructions (Addendum)
EGD Discharge instructions Please read the instructions outlined below and refer to this sheet in the next few weeks. These discharge instructions provide you with general information on caring for yourself after you leave the hospital. Your doctor may also give you specific instructions. While your treatment has been planned according to the most current medical practices available, unavoidable complications occasionally occur. If you have any problems or questions after discharge, please call your doctor. ACTIVITY You may resume your regular activity but move at a slower pace for the next 24 hours.  Take frequent rest periods for the next 24 hours.  Walking will help expel (get rid of) the air and reduce the bloated feeling in your abdomen.  No driving for 24 hours (because of the anesthesia (medicine) used during the test).  You may shower.  Do not sign any important legal documents or operate any machinery for 24 hours (because of the anesthesia used during the test).  NUTRITION Drink plenty of fluids.  You may resume your normal diet.  Begin with a light meal and progress to your normal diet.  Avoid alcoholic beverages for 24 hours or as instructed by your caregiver.  MEDICATIONS You may resume your normal medications unless your caregiver tells you otherwise.  WHAT YOU CAN EXPECT TODAY You may experience abdominal discomfort such as a feeling of fullness or "gas" pains.  FOLLOW-UP Your doctor will discuss the results of your test with you.  SEEK IMMEDIATE MEDICAL ATTENTION IF ANY OF THE FOLLOWING OCCUR: Excessive nausea (feeling sick to your stomach) and/or vomiting.  Severe abdominal pain and distention (swelling).  Trouble swallowing.  Temperature over 101 F (37.8 C).  Rectal bleeding or vomiting of blood.    Colonoscopy Discharge Instructions  Read the instructions outlined below and refer to this sheet in the next few weeks. These discharge instructions provide you with  general information on caring for yourself after you leave the hospital. Your doctor may also give you specific instructions. While your treatment has been planned according to the most current medical practices available, unavoidable complications occasionally occur.   ACTIVITY You may resume your regular activity, but move at a slower pace for the next 24 hours.  Take frequent rest periods for the next 24 hours.  Walking will help get rid of the air and reduce the bloated feeling in your belly (abdomen).  No driving for 24 hours (because of the medicine (anesthesia) used during the test).   Do not sign any important legal documents or operate any machinery for 24 hours (because of the anesthesia used during the test).  NUTRITION Drink plenty of fluids.  You may resume your normal diet as instructed by your doctor.  Begin with a light meal and progress to your normal diet. Heavy or fried foods are harder to digest and may make you feel sick to your stomach (nauseated).  Avoid alcoholic beverages for 24 hours or as instructed.  MEDICATIONS You may resume your normal medications unless your doctor tells you otherwise.  WHAT YOU CAN EXPECT TODAY Some feelings of bloating in the abdomen.  Passage of more gas than usual.  Spotting of blood in your stool or on the toilet paper.  IF YOU HAD POLYPS REMOVED DURING THE COLONOSCOPY: No aspirin products for 7 days or as instructed.  No alcohol for 7 days or as instructed.  Eat a soft diet for the next 24 hours.  FINDING OUT THE RESULTS OF YOUR TEST Not all test results are available  during your visit. If your test results are not back during the visit, make an appointment with your caregiver to find out the results. Do not assume everything is normal if you have not heard from your caregiver or the medical facility. It is important for you to follow up on all of your test results.  SEEK IMMEDIATE MEDICAL ATTENTION IF: You have more than a spotting of  blood in your stool.  Your belly is swollen (abdominal distention).  You are nauseated or vomiting.  You have a temperature over 101.  You have abdominal pain or discomfort that is severe or gets worse throughout the day.   Your EGD revealed mild amount inflammation in your stomach.  You also had 1 gastric polyp which I removed successfully.  Await pathology results, office will contact you.  Continue on pantoprazole daily.  Unfortunately, your colon was not adequately prepped for colonoscopy today.  We will need to repeat this in 3 to 6 months with extended colon preparation.  I hope you have a great rest of your week!  Hennie Duos. Marletta Lor, D.O. Gastroenterology and Hepatology [facility]

## 2021-07-21 NOTE — Anesthesia Procedure Notes (Signed)
Date/Time: 07/21/2021 8:44 AM Performed by: Julian Reil, CRNA Pre-anesthesia Checklist: Emergency Drugs available, Patient identified, Suction available and Patient being monitored Patient Re-evaluated:Patient Re-evaluated prior to induction Oxygen Delivery Method: Nasal cannula Induction Type: IV induction Placement Confirmation: positive ETCO2

## 2021-07-24 ENCOUNTER — Encounter (HOSPITAL_COMMUNITY): Payer: Self-pay | Admitting: Internal Medicine

## 2021-07-24 LAB — SURGICAL PATHOLOGY

## 2021-08-20 ENCOUNTER — Ambulatory Visit (HOSPITAL_COMMUNITY)
Admission: RE | Admit: 2021-08-20 | Discharge: 2021-08-20 | Disposition: A | Discharge status: Home / Self Care | Payer: Medicare Other | Source: Ambulatory Visit | Attending: Neurosurgery | Admitting: Neurosurgery

## 2021-08-20 ENCOUNTER — Other Ambulatory Visit: Payer: Self-pay

## 2021-08-20 DIAGNOSIS — M4126 Other idiopathic scoliosis, lumbar region: Secondary | ICD-10-CM | POA: Diagnosis present

## 2021-09-03 ENCOUNTER — Other Ambulatory Visit: Payer: Self-pay | Admitting: Neurosurgery

## 2021-09-11 ENCOUNTER — Other Ambulatory Visit: Payer: Self-pay | Admitting: Neurosurgery

## 2021-09-25 ENCOUNTER — Encounter (HOSPITAL_COMMUNITY)
Admission: RE | Admit: 2021-09-25 | Discharge: 2021-09-25 | Disposition: A | Discharge status: Home / Self Care | Payer: Medicare Other | Source: Ambulatory Visit | Attending: Nephrology | Admitting: Nephrology

## 2021-09-25 ENCOUNTER — Encounter (HOSPITAL_COMMUNITY): Payer: Self-pay

## 2021-09-25 DIAGNOSIS — D509 Iron deficiency anemia, unspecified: Secondary | ICD-10-CM | POA: Diagnosis not present

## 2021-09-25 MED ORDER — SODIUM CHLORIDE 0.9 % IV SOLN: INTRAVENOUS | Status: DC

## 2021-09-25 MED ORDER — SODIUM CHLORIDE 0.9 % IV SOLN
510.0000 mg | Freq: Once | INTRAVENOUS | Status: AC
  Administered 2021-09-25: 510 mg via INTRAVENOUS
  Filled 2021-09-25: qty 510

## 2021-10-02 ENCOUNTER — Encounter (HOSPITAL_COMMUNITY)
Admission: RE | Admit: 2021-10-02 | Discharge: 2021-10-02 | Disposition: A | Discharge status: Home / Self Care | Payer: Medicare Other | Source: Ambulatory Visit | Attending: Nephrology | Admitting: Nephrology

## 2021-10-02 ENCOUNTER — Encounter (HOSPITAL_COMMUNITY): Payer: Self-pay

## 2021-10-02 DIAGNOSIS — D509 Iron deficiency anemia, unspecified: Secondary | ICD-10-CM | POA: Diagnosis not present

## 2021-10-02 MED ORDER — SODIUM CHLORIDE 0.9 % IV SOLN: Freq: Once | INTRAVENOUS | Status: AC

## 2021-10-02 MED ORDER — SODIUM CHLORIDE 0.9 % IV SOLN
510.0000 mg | Freq: Once | INTRAVENOUS | Status: AC
  Administered 2021-10-02: 510 mg via INTRAVENOUS
  Filled 2021-10-02: qty 510

## 2021-10-14 ENCOUNTER — Inpatient Hospital Stay: Admit: 2021-10-14 | Payer: Medicare Other

## 2021-10-14 SURGERY — POSTERIOR CERVICAL FUSION/FORAMINOTOMY LEVEL 3
Anesthesia: General

## 2021-12-23 ENCOUNTER — Ambulatory Visit (INDEPENDENT_AMBULATORY_CARE_PROVIDER_SITE_OTHER): Payer: Medicare Other | Admitting: Gastroenterology

## 2021-12-23 ENCOUNTER — Encounter: Payer: Self-pay | Admitting: Gastroenterology

## 2021-12-23 VITALS — BP 130/60 | HR 62 | Temp 97.3°F | Ht 69.0 in | Wt 164.0 lb

## 2021-12-23 DIAGNOSIS — Z1211 Encounter for screening for malignant neoplasm of colon: Secondary | ICD-10-CM

## 2021-12-23 NOTE — Patient Instructions (Signed)
We are arranging a colonoscopy with Dr. Marletta Lor in the near future! ? ?Please stop Eliquis 2 days before the procedure. ? ?We are having you do an extra 1/2 day of clear liquids before the procedure. ? ?Further recommendations to follow! ? ?I enjoyed seeing you again today! As you know, I value our relationship and want to provide genuine, compassionate, and quality care. I welcome your feedback. If you receive a survey regarding your visit,  I greatly appreciate you taking time to fill this out. See you next time! ? ?Gelene Mink, PhD, ANP-BC ?[facility]  ? ?

## 2021-12-23 NOTE — Progress Notes (Signed)
Gastroenterology Office Note     Primary Care Physician:  Coolidge Breeze, FNP  Primary Gastroenterologist: Dr. Abbey Chatters   Chief Complaint   Chief Complaint  Patient presents with   Follow-up    To schedule colonoscopy     History of Present Illness   Mario [name] is a 71 y.o. 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 2 blood transfusions last year in [place]. Colonoscopy/EGD had been planned last year. He completed EGD Dec 2022 with one gastric polyp s/p resection and retrieval, normal duodenum. Colonoscopy unable to be completed due to poor prep. Will need extended prep and 2 days of clear liquids.    No rectal bleeding. Did not finish prep, as he got tired of it. He is willing to finish it now. Aide with him. No abdominal pain. No N/V. No GERD. No concerns today.     Past Medical History:  Diagnosis Date   Anemia    Arthritis    Atrial fibrillation (HCC)    CHF (congestive heart failure) ([place])    CKD (chronic kidney disease)    Dysrhythmia    HTN (hypertension)     Past Surgical History:  Procedure Laterality Date   ANKLE SURGERY Left    tumor removed from left ankle per patient   COLONOSCOPY WITH PROPOFOL N/A 07/21/2021   poor prep   ESOPHAGOGASTRODUODENOSCOPY (EGD) WITH PROPOFOL N/A 07/21/2021   one gastric polyp s/p resection and retrieval, normal duodenum   FOOT SURGERY     none     POLYPECTOMY  07/21/2021   Procedure: POLYPECTOMY;  Surgeon: Eloise Harman, DO;  Location: [facility];  Service: Endoscopy;;    Current Outpatient Medications  Medication Sig Dispense Refill   acetaminophen (TYLENOL) 325 MG tablet Take 650 mg by mouth every 6 (six) hours as needed for moderate pain.     amiodarone (PACERONE) 200 MG tablet Take 200 mg by mouth 2 (two) times daily.     amLODipine (NORVASC) 10 MG tablet Take 10 mg by mouth daily.     calcitRIOL (ROCALTROL) 0.25 MCG capsule Take 0.25 mcg  by mouth 3 (three) times a week.     ELIQUIS 5 MG TABS tablet Take 5 mg by mouth 2 (two) times daily.     finasteride (PROSCAR) 5 MG tablet Take 5 mg by mouth daily.     furosemide (LASIX) 20 MG tablet Take 20 mg by mouth daily.     metoprolol succinate (TOPROL-XL) 25 MG 24 hr tablet Take 25 mg by mouth daily.     pantoprazole (PROTONIX) 40 MG tablet Take 40 mg by mouth daily.     tamsulosin (FLOMAX) 0.4 MG CAPS capsule Take 0.4 mg by mouth daily.     valsartan (DIOVAN) 40 MG tablet Take 40 mg by mouth daily.     No current facility-administered medications for this visit.    Allergies as of 12/23/2021   (No Known Allergies)    Family History  Problem Relation Age of Onset   Cancer Sister        unknown type   Colon cancer Neg Hx    Colon polyps Neg Hx     Social History   Socioeconomic History   Marital status: Single    Spouse name: Not on file   Number of children: Not on file   Years of education: Not on file   Highest education level: Not on file  Occupational History   Not on file  Tobacco Use   Smoking status: Former    Types: Cigarettes   Smokeless tobacco: Former    Types: Chew  Substance and Sexual Activity   Alcohol use: Not Currently   Drug use: Not Currently   Sexual activity: Not on file  Other Topics Concern   Not on file  Social History Narrative   Not on file   Social Determinants of Health   Financial Resource Strain: Not on file  Food Insecurity: Not on file  Transportation Needs: Not on file  Physical Activity: Not on file  Stress: Not on file  Social Connections: Not on file  Intimate Partner Violence: Not on file     Review of Systems   Gen: Denies any fever, chills, fatigue, weight loss, lack of appetite.  CV: Denies chest pain, heart palpitations, peripheral edema, syncope.  Resp: Denies shortness of breath at rest or with exertion. Denies wheezing or cough.  GI: see HPI GU : Denies urinary burning, urinary frequency, urinary  hesitancy MS: Denies joint pain, muscle weakness, cramps, or limitation of movement.  Derm: Denies rash, itching, dry skin Psych: Denies depression, anxiety, memory loss, and confusion Heme: Denies bruising, bleeding, and enlarged lymph nodes.   Physical Exam   BP 130/60   Pulse 62   Temp (!) 97.3 F (36.3 C)   Ht 5\' 9"  (1.753 m)   Wt 164 lb (74.4 kg)   BMI 24.22 kg/m  General:   Alert and oriented. Pleasant and cooperative. Well-nourished and well-developed.  Head:  Normocephalic and atraumatic. Eyes:  Without icterus Abdomen:  +BS, soft, non-tender and non-distended. No HSM noted. No guarding or rebound. No masses appreciated.  Rectal:  Deferred  Msk:  Symmetrical without gross deformities. Normal posture. Extremities:  Without edema. Neurologic:  Alert and  oriented x4;  grossly normal neurologically. Skin:  Intact without significant lesions or rashes. Psych:  Alert and cooperative. Normal mood and affect.  Lab Results  Component Value Date   WBC 6.6 07/17/2021   HGB 11.0 (L) 07/17/2021   HCT 34.2 (L) 07/17/2021   MCV 85.1 07/17/2021   PLT 320 07/17/2021     Assessment   Mario [name] is a 71 y.o. 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 2 blood transfusions last year in [place]. Colonoscopy/EGD had been planned last year. He completed EGD Dec 2022 with one gastric polyp s/p resection and retrieval, normal duodenum. Colonoscopy unable to be completed due to poor prep.    No concerning lower GI signs/symptoms. He reports that he simply did not complete the prep but understands the importance of this now. Caretaker present with him  PLAN    Proceed with colonoscopy by Dr. Abbey Chatters  in near future: the risks, benefits, and alternatives have been discussed with the patient in detail. The patient states understanding and desires to proceed.  Hold Eliquis 2 days prior Complete whole prep, extra day of clear  liquids Further recommendations to follow    [name], PhD, ANP-BC [facility]

## 2021-12-25 ENCOUNTER — Telehealth: Payer: Self-pay | Admitting: *Deleted

## 2021-12-25 NOTE — Telephone Encounter (Signed)
Marylene Land called back. Dates offered did not work for her. She wants a call once we have [place] schedule ?

## 2021-12-25 NOTE — Telephone Encounter (Signed)
Called angela and LMOVM to call back to schedule patient for TCS with Dr. Marletta Lor, ASA 3. See encounter form for special instructions. ?

## 2022-01-19 NOTE — Telephone Encounter (Signed)
LMOVM to call back for [facility]

## 2022-01-20 NOTE — Telephone Encounter (Signed)
Tried to call Mario [name], LMOVM for return call.

## 2022-01-22 NOTE — Telephone Encounter (Signed)
Letter mailed

## 2022-02-23 ENCOUNTER — Other Ambulatory Visit: Payer: Self-pay | Admitting: Neurosurgery

## 2022-02-27 ENCOUNTER — Other Ambulatory Visit: Payer: Self-pay | Admitting: Neurosurgery

## 2022-04-13 NOTE — Pre-Procedure Instructions (Signed)
Surgical Instructions    Your procedure is scheduled on April 22, 2022.  Report to [facility] Main Entrance "A" at 10:30 A.M., then check in with the Admitting office.  Call this number if you have problems the morning of surgery:  [phone]   If you have any questions prior to your surgery date call [phone]: Open Monday-Friday 8am-4pm    Remember:  Do not eat or drink after midnight the night before your surgery      Take these medicines the morning of surgery with A SIP OF WATER:  amiodarone (PACERONE)  amLODipine (NORVASC)  calcitRIOL (ROCALTROL)   finasteride (PROSCAR)  metoprolol succinate (TOPROL-XL)  pantoprazole (PROTONIX)   tamsulosin (FLOMAX)  acetaminophen (TYLENOL) - may take as needed   STOP your ELIQUIS two days prior to surgery. Your last dose of this medication will be September 3rd.   As of today, STOP taking any Aspirin (unless otherwise instructed by your surgeon) Aleve, Naproxen, Ibuprofen, Motrin, Advil, Goody's, BC's, all herbal medications, fish oil, and all vitamins.                     Do NOT Smoke (Tobacco/Vaping) for 24 hours prior to your procedure.  If you use a CPAP at night, you may bring your mask/headgear for your overnight stay.   Contacts, glasses, piercing's, hearing aid's, dentures or partials may not be worn into surgery, please bring cases for these belongings.    For patients admitted to the hospital, discharge time will be determined by your treatment team.   Patients discharged the day of surgery will not be allowed to drive home, and someone needs to stay with them for 24 hours.  SURGICAL WAITING ROOM VISITATION Patients having surgery or a procedure may have no more than 2 support people in the waiting area - these visitors may rotate.   Children under the age of 72 must have an adult with them who is not the patient. If the patient needs to stay at the hospital during part of their recovery, the visitor guidelines  for inpatient rooms apply. Pre-op nurse will coordinate an appropriate time for 1 support person to accompany patient in pre-op.  This support person may not rotate.   Please refer to the [facility] website for the visitor guidelines for Inpatients (after your surgery is over and you are in a regular room).    Special instructions:   [facility]- Preparing For Surgery  Before surgery, you can play an important role. Because skin is not sterile, your skin needs to be as free of germs as possible. You can reduce the number of germs on your skin by washing with CHG (chlorahexidine gluconate) Soap before surgery.  CHG is an antiseptic cleaner which kills germs and bonds with the skin to continue killing germs even after washing.    Oral Hygiene is also important to reduce your risk of infection.  Remember - BRUSH YOUR TEETH THE MORNING OF SURGERY WITH YOUR REGULAR TOOTHPASTE  Please do not use if you have an allergy to CHG or antibacterial soaps. If your skin becomes reddened/irritated stop using the CHG.  Do not shave (including legs and underarms) for at least 48 hours prior to first CHG shower. It is OK to shave your face.  Please follow these instructions carefully.   Shower the NIGHT BEFORE SURGERY and the MORNING OF SURGERY  If you chose to wash your hair, wash your hair first as usual with your normal shampoo.  After you shampoo, rinse your hair and body thoroughly to remove the shampoo.  Use CHG Soap as you would any other liquid soap. You can apply CHG directly to the skin and wash gently with a scrungie or a clean washcloth.   Apply the CHG Soap to your body ONLY FROM THE NECK DOWN.  Do not use on open wounds or open sores. Avoid contact with your eyes, ears, mouth and genitals (private parts). Wash Face and genitals (private parts)  with your normal soap.   Wash thoroughly, paying special attention to the area where your surgery will be performed.  Thoroughly rinse your body with  warm water from the neck down.  DO NOT shower/wash with your normal soap after using and rinsing off the CHG Soap.  Pat yourself dry with a CLEAN TOWEL.  Wear CLEAN PAJAMAS to bed the night before surgery  Place CLEAN SHEETS on your bed the night before your surgery  DO NOT SLEEP WITH PETS.   Day of Surgery: Take a shower with CHG soap. Do not wear jewelry or makeup Do not wear lotions, powders, colognes, or deodorant. Do not shave 48 hours prior to surgery.  Men may shave face and neck. Do not bring valuables to the hospital.  [facility] is not responsible for any belongings or valuables. Do not wear nail polish, gel polish, artificial nails, or any other type of covering on natural nails (fingers and toes) If you have artificial nails or gel coating that need to be removed by a nail salon, please have this removed prior to surgery. Artificial nails or gel coating may interfere with anesthesia's ability to adequately monitor your vital signs.  Wear Clean/Comfortable clothing the morning of surgery Remember to brush your teeth WITH YOUR REGULAR TOOTHPASTE.   Please read over the following fact sheets that you were given.    If you received a COVID test during your pre-op visit  it is requested that you wear a mask when out in public, stay away from anyone that may not be feeling well and notify your surgeon if you develop symptoms. If you have been in contact with anyone that has tested positive in the last 10 days please notify you surgeon.

## 2022-04-14 ENCOUNTER — Encounter (HOSPITAL_COMMUNITY)
Admission: RE | Admit: 2022-04-14 | Discharge: 2022-04-14 | Disposition: A | Discharge status: Home / Self Care | Payer: Medicare Other | Source: Ambulatory Visit | Attending: Neurosurgery | Admitting: Neurosurgery

## 2022-04-14 ENCOUNTER — Other Ambulatory Visit: Payer: Self-pay

## 2022-04-14 ENCOUNTER — Encounter (HOSPITAL_COMMUNITY): Payer: Self-pay

## 2022-04-14 VITALS — BP 174/97 | HR 73 | Temp 97.8°F | Resp 18 | Ht 68.0 in | Wt 167.6 lb

## 2022-04-14 DIAGNOSIS — I4892 Unspecified atrial flutter: Secondary | ICD-10-CM | POA: Insufficient documentation

## 2022-04-14 DIAGNOSIS — Z01818 Encounter for other preprocedural examination: Secondary | ICD-10-CM | POA: Insufficient documentation

## 2022-04-14 DIAGNOSIS — I130 Hypertensive heart and chronic kidney disease with heart failure and stage 1 through stage 4 chronic kidney disease, or unspecified chronic kidney disease: Secondary | ICD-10-CM | POA: Diagnosis not present

## 2022-04-14 DIAGNOSIS — I4891 Unspecified atrial fibrillation: Secondary | ICD-10-CM | POA: Diagnosis not present

## 2022-04-14 DIAGNOSIS — M4712 Other spondylosis with myelopathy, cervical region: Secondary | ICD-10-CM | POA: Diagnosis not present

## 2022-04-14 DIAGNOSIS — I2510 Atherosclerotic heart disease of native coronary artery without angina pectoris: Secondary | ICD-10-CM

## 2022-04-14 DIAGNOSIS — Z87891 Personal history of nicotine dependence: Secondary | ICD-10-CM | POA: Diagnosis not present

## 2022-04-14 HISTORY — DX: Dyspnea, unspecified: R06.00

## 2022-04-14 LAB — TYPE AND SCREEN
ABO/RH(D): B POS
Antibody Screen: NEGATIVE

## 2022-04-14 LAB — SURGICAL PCR SCREEN
MRSA, PCR: NEGATIVE
Staphylococcus aureus: NEGATIVE

## 2022-04-14 NOTE — Progress Notes (Signed)
PCP - Coral Ceo, FNP Cardiologist - Dr. Laural Golden [facility]  PPM/ICD - Denies Device Orders - n/a Rep Notified - n/a  Chest x-ray - n/a EKG - Requested at PAT visit Stress Test - Requested ECHO - Requested Cardiac Cath - Requested  Sleep Study - Denies CPAP - n/a  No DM  Blood Thinner Instructions: Pt will stop Eliquis 9/4. Last dose will be 9/3. Pt understood instructions. Aspirin Instructions: n/a  NPO after midnight  COVID TEST- n/a   Anesthesia review: Yes. Cardiac Clearance note from 03/24/2022 copied and placed in chart. Cardiac records requested from MD. Antionette Poles, PA-C aware.  [employer], Marylene Land, present at [employer] appointment. She has been working with patient since January 2023 and assists with office visits, transportation, medication management.   Patient denies shortness of breath, fever, cough and chest pain at PAT appointment   All instructions explained to the patient, with a verbal understanding of the material. Patient agrees to go over the instructions while at home for a better understanding. Patient also instructed to self quarantine after being tested for COVID-19. The opportunity to ask questions was provided.

## 2022-04-15 ENCOUNTER — Encounter (HOSPITAL_COMMUNITY): Payer: Self-pay

## 2022-04-15 NOTE — Anesthesia Preprocedure Evaluation (Addendum)
Anesthesia Evaluation  Patient identified by MRN, date of birth, ID band Patient awake    Reviewed: Allergy & Precautions, NPO status , Patient's Chart, lab work & pertinent test results  Airway Mallampati: I  TM Distance: >3 FB Neck ROM: Full    Dental  (+) Dental Advisory Given, Partial Upper, Edentulous Lower   Pulmonary shortness of breath, former smoker,    Pulmonary exam normal breath sounds clear to auscultation       Cardiovascular hypertension, Pt. on medications and Pt. on home beta blockers +CHF  + dysrhythmias Atrial Fibrillation  Rhythm:Regular Rate:Normal  Echo 07/31/21 ([facility]): Normal LV size with normal systolic function.  Ejection fraction is 55 to 60%.  There is moderately increased left ventricular wall thickness. Normal right ventricular size and normal function. Slightly dilated left atrium. There is mild mitral regurgitation. There is mild tricuspid regurgitation. Normal pulmonary artery systolic pressure (PASP 33 mmHg).   - Comparison echo 10/17/19 in setting of acute PE and new onset aflutter: EF 40-45%, global LV hypokinesis, mild-moderate TR, RV moderately dilated with moderately decreased systolic function)   Neuro/Psych negative neurological ROS     GI/Hepatic negative GI ROS, Neg liver ROS,   Endo/Other  negative endocrine ROS  Renal/GU Renal disease     Musculoskeletal  (+) Arthritis ,   Abdominal   Peds  Hematology  (+) Blood dyscrasia, anemia ,   Anesthesia Other Findings   Reproductive/Obstetrics                           Anesthesia Physical Anesthesia Plan  ASA: 3  Anesthesia Plan: General   Post-op Pain Management: Tylenol PO (pre-op)*   Induction: Intravenous  PONV Risk Score and Plan: 3 and Ondansetron, Dexamethasone and Treatment may vary due to age or medical condition  Airway Management Planned: Oral ETT  Additional Equipment: Arterial  line  Intra-op Plan:   Post-operative Plan: Extubation in OR  Informed Consent: I have reviewed the patients History and Physical, chart, labs and discussed the procedure including the risks, benefits and alternatives for the proposed anesthesia with the patient or authorized representative who has indicated his/her understanding and acceptance.     Dental advisory given  Plan Discussed with: CRNA  Anesthesia Plan Comments: (PAT note written 04/15/2022 by Shonna Chock, PA-C.)     Anesthesia Quick Evaluation

## 2022-04-15 NOTE — Progress Notes (Signed)
Anesthesia Chart Review:  Case: [id number] Date/Time: 04/22/22 1215   Procedure: POSTERIOR CERVICAL FUSION  W/LAT MASS FIXATION C23, C34, C45   Anesthesia type: General   Pre-op diagnosis: CERVICAL MYELOPATHY   Location: [facility] ROOM 18 / [facility]   Surgeons: Bedelia Person, MD       DISCUSSION: Patient is a 71 year old male scheduled for the above procedure.  History includes former smoker, HTN, atrial fib/flutter (new onset 10/17/19, s/p DCCV 12/18/19), CHF, PE (10/17/19 in setting of new onset aflutter), dyspnea, CKD (stage 3b), arthritis, anemia.  Last cardiology visit with Dr. Kathryne Sharper at [facility] was on 04/02/2022 for follow-up atrial flutter, tachycardia induced cardiomyopathy, HTN, HLD.  Discussed that he could discontinue metoprolol due to fatigue and dyspnea with mild bradycardia, but advised to use for breath through aflutter (none in 2 years). Continue low-dose amiodarone.  TTE in December 2022 showed EF 55 to 60%, moderate LVH, mild MR/TR. in regards to upcoming spinal surgery he wrote, patient given instructions regarding the need for holding of his anticoagulation.  He has a low cardiac risk for the procedure and should proceed as planned." Per medication list, cardiac medications include amiodarone 100 mg daily, amlodipine 10 mg daily x28 days, Eliquis 5 mg twice daily, Lasix 20 mg daily, Toprol-XL 25 mg daily, valsartan 20 mg daily.  He had labs (CMP, TSH, FT4, CBC) on 03/24/22 per Dr. Kathryne Sharper.  Results included glucose 99, creatinine 1.26, BUN 20, sodium 142, potassium 3.7, calcium 9.1, albumin 3.9, total bilirubin 1.6, alkaline phosphatase 68, AST 23, ALT 22, TSH 0.48, free T41.6, WBC 6.6, hemoglobin 13.1, hematocrit 39.9, platelet count 272. T&S done on 04/14/22 at [facility].   Last dose Eliquis planned for 04/19/22.  Anesthesia team to evaluate on the day of surgery.    VS: BP (!) 174/97   Pulse 73   Temp 36.6 C (Oral)   Resp 18   Ht 5\' 8"  (1.727 m)   Wt 76 kg    SpO2 100%   BMI 25.48 kg/m   PROVIDERS: , FNP is PCP  Wilmon Pali, MD is cardiologist Laural Golden, MD is nephrologist.  Last visit 01/28/2022. CKD diagnosed 2021 with Creatinine ~ 1.5-2.0 since then--in 2023 Cr ~ 1.5-1.8 range.    LABS: See DISCUSSION. (all labs ordered are listed, but only abnormal results are displayed)  Labs Reviewed  SURGICAL PCR SCREEN  TYPE AND SCREEN    IMAGES: CT L-spine 08/20/21:  IMPRESSION: 1. Study for stereotactic surgical planning. 2. Bulky disc extrusions at L1-L2 and L2-L3 better demonstrated by MRI. Underlying severe chronic L2-L3 disc, endplate, and left side facet degeneration. 3. Moderate to severe facet arthropathy at both L3-L4 and L4-L5 is associated with grade 1 anterolisthesis. Vacuum facet. Multifactorial spinal stenosis at the former. 4.  Aortic Atherosclerosis (ICD10-I70.0).   MRI C-spine 07/15/21: IMPRESSION: 1. Advanced multilevel cervical spondylosis resulting in severe canal stenosis at C3-4 and C4-5. Moderate C2-3 and mild C5-6 and C6-7 canal stenosis. 2. Moderate-severe bilateral foraminal stenosis from C3-4 through C6-7.  MRI L-spine 06/11/21: IMPRESSION: 1. Diffuse lumbar spine spondylosis as described above most severe at L1-2 and L2-3. 2.  No acute osseous injury of the lumbar spine. 3. At L1-2 there is a broad-based disc bulge with a large left paracentral disc extrusion with mass effect on the intraspinal nerve roots. Mild bilateral facet arthropathy. Severe spinal stenosis. Moderate left and mild right foraminal stenosis. 4. At L2-3 there is a broad-based disc  bulge with a large left paracentral disc extrusion with cephalad migration of disc material and mass effect upon the left intraspinal nerve roots. No right foraminal stenosis. Mild left foraminal stenosis. Severe spinal stenosis.    EKG: EKG 04/02/22 ([facility]): Sinus bradycardia at 52 bpm.  Consider old anterior  infarct.   CV: Echo 07/31/21 ([facility]): Impression: Normal LV size with normal systolic function.  Ejection fraction is 55 to 60%.  There is moderately increased left ventricular wall thickness. Normal right ventricular size and normal function. Slightly dilated left atrium. There is mild mitral regurgitation. There is mild tricuspid regurgitation. Normal pulmonary artery systolic pressure (PASP 33 mmHg). - Comparison echo 10/17/19 in setting of acute PE and new onset aflutter: EF 40-45%, global LV hypokinesis, mild-moderate TR, RV moderately dilated with moderately decreased systolic function)  Nuclear stress test 12/19/19 ([facility]): As outlined in [facility] records: "EF 45, global, mod size mild severity diffuse inferior defect with slight reversibility distally likely due to bowel overlap at rest" LHC referred due to elevated Creatinine 1.98 with unclear baseline.    Past Medical History:  Diagnosis Date   Anemia    Arthritis    Atrial fibrillation (HCC)    CHF (congestive heart failure) (HCC)    CKD (chronic kidney disease)    Dyspnea    r/t heart failure   Dysrhythmia    A.Fib/A.Flutter   HTN (hypertension)    PE (pulmonary thromboembolism) (HCC) 10/17/2019   acute PE in setting of new onset aflutter    Past Surgical History:  Procedure Laterality Date   ANKLE SURGERY Left    tumor removed from left ankle per patient   CARDIOVERSION  2021   COLONOSCOPY WITH PROPOFOL N/A 07/21/2021   poor prep   ESOPHAGOGASTRODUODENOSCOPY (EGD) WITH PROPOFOL N/A 07/21/2021   one gastric polyp s/p resection and retrieval, normal duodenum   FOOT SURGERY     none     POLYPECTOMY  07/21/2021   Procedure: POLYPECTOMY;  Surgeon: Lanelle Bal, DO;  Location: [facility];  Service: Endoscopy;;    MEDICATIONS:  acetaminophen (TYLENOL) 325 MG tablet   amiodarone (PACERONE) 200 MG tablet   amLODipine (NORVASC) 10 MG tablet   calcitRIOL (ROCALTROL) 0.25 MCG capsule   ELIQUIS 5  MG TABS tablet   finasteride (PROSCAR) 5 MG tablet   furosemide (LASIX) 20 MG tablet   ibuprofen (ADVIL) 200 MG tablet   metoprolol succinate (TOPROL-XL) 25 MG 24 hr tablet   pantoprazole (PROTONIX) 40 MG tablet   tamsulosin (FLOMAX) 0.4 MG CAPS capsule   valsartan (DIOVAN) 40 MG tablet   No current facility-administered medications for this encounter.   Shonna Chock, PA-C Surgical Short Stay/Anesthesiology [facility] Phone [phone] [facility] Phone [phone] 04/15/2022 5:45 PM

## 2022-04-21 NOTE — Progress Notes (Signed)
Patient was called and informed that the surgery time for tomorrow was changed to 13:56 o'clock. Patient was instructed to be at the hospital at 12:00 o'clock. Patient verbalized understanding. Thayer Headings from [facility] was called but she didn't answer and her voicemail box is full.

## 2022-04-22 ENCOUNTER — Encounter (HOSPITAL_COMMUNITY): Payer: Self-pay

## 2022-04-22 ENCOUNTER — Inpatient Hospital Stay (HOSPITAL_COMMUNITY): Payer: Medicare Other | Admitting: Certified Registered Nurse Anesthetist

## 2022-04-22 ENCOUNTER — Inpatient Hospital Stay (HOSPITAL_COMMUNITY): Payer: Medicare Other

## 2022-04-22 ENCOUNTER — Other Ambulatory Visit: Payer: Self-pay

## 2022-04-22 ENCOUNTER — Inpatient Hospital Stay (HOSPITAL_COMMUNITY)
Admission: RE | Admit: 2022-04-22 | Discharge: 2022-04-28 | DRG: 472 | Disposition: A | Discharge status: Skilled Nursing Facility | Payer: Medicare Other | Attending: Neurosurgery | Admitting: Neurosurgery

## 2022-04-22 ENCOUNTER — Encounter (HOSPITAL_COMMUNITY)
Admission: RE | Disposition: A | Discharge status: Skilled Nursing Facility | Payer: Self-pay | Source: Home / Self Care | Attending: Neurosurgery

## 2022-04-22 ENCOUNTER — Inpatient Hospital Stay (HOSPITAL_COMMUNITY): Payer: Medicare Other | Admitting: Vascular Surgery

## 2022-04-22 DIAGNOSIS — G959 Disease of spinal cord, unspecified: Secondary | ICD-10-CM | POA: Diagnosis not present

## 2022-04-22 DIAGNOSIS — Z7901 Long term (current) use of anticoagulants: Secondary | ICD-10-CM | POA: Diagnosis not present

## 2022-04-22 DIAGNOSIS — I4891 Unspecified atrial fibrillation: Secondary | ICD-10-CM | POA: Diagnosis present

## 2022-04-22 DIAGNOSIS — I4892 Unspecified atrial flutter: Secondary | ICD-10-CM | POA: Diagnosis present

## 2022-04-22 DIAGNOSIS — I10 Essential (primary) hypertension: Secondary | ICD-10-CM | POA: Diagnosis present

## 2022-04-22 DIAGNOSIS — M4722 Other spondylosis with radiculopathy, cervical region: Secondary | ICD-10-CM | POA: Diagnosis present

## 2022-04-22 DIAGNOSIS — Z86711 Personal history of pulmonary embolism: Secondary | ICD-10-CM

## 2022-04-22 DIAGNOSIS — G992 Myelopathy in diseases classified elsewhere: Principal | ICD-10-CM | POA: Diagnosis present

## 2022-04-22 DIAGNOSIS — M4712 Other spondylosis with myelopathy, cervical region: Secondary | ICD-10-CM | POA: Diagnosis present

## 2022-04-22 DIAGNOSIS — M1990 Unspecified osteoarthritis, unspecified site: Secondary | ICD-10-CM | POA: Diagnosis present

## 2022-04-22 DIAGNOSIS — I110 Hypertensive heart disease with heart failure: Secondary | ICD-10-CM

## 2022-04-22 DIAGNOSIS — M4802 Spinal stenosis, cervical region: Secondary | ICD-10-CM | POA: Diagnosis present

## 2022-04-22 DIAGNOSIS — Z23 Encounter for immunization: Secondary | ICD-10-CM | POA: Diagnosis present

## 2022-04-22 DIAGNOSIS — I509 Heart failure, unspecified: Secondary | ICD-10-CM

## 2022-04-22 DIAGNOSIS — Z87891 Personal history of nicotine dependence: Secondary | ICD-10-CM

## 2022-04-22 DIAGNOSIS — Z751 Person awaiting admission to adequate facility elsewhere: Secondary | ICD-10-CM

## 2022-04-22 HISTORY — PX: POSTERIOR CERVICAL FUSION/FORAMINOTOMY: SHX5038

## 2022-04-22 LAB — ABO/RH: ABO/RH(D): B POS

## 2022-04-22 SURGERY — POSTERIOR CERVICAL FUSION/FORAMINOTOMY LEVEL 3
Anesthesia: General

## 2022-04-22 MED ORDER — FUROSEMIDE 20 MG PO TABS
20.0000 mg | ORAL_TABLET | Freq: Every day | ORAL | Status: DC
  Administered 2022-04-23 – 2022-04-28 (×6): 20 mg via ORAL
  Filled 2022-04-22 (×6): qty 1

## 2022-04-22 MED ORDER — AMLODIPINE BESYLATE 10 MG PO TABS
10.0000 mg | ORAL_TABLET | Freq: Every day | ORAL | Status: DC
  Administered 2022-04-23 – 2022-04-28 (×6): 10 mg via ORAL
  Filled 2022-04-22 (×6): qty 1

## 2022-04-22 MED ORDER — DEXAMETHASONE SODIUM PHOSPHATE 10 MG/ML IJ SOLN
INTRAMUSCULAR | Status: DC | PRN
  Administered 2022-04-22: 10 mg via INTRAVENOUS

## 2022-04-22 MED ORDER — AMISULPRIDE (ANTIEMETIC) 5 MG/2ML IV SOLN: 10.0000 mg | Freq: Once | INTRAVENOUS | Status: DC | PRN

## 2022-04-22 MED ORDER — CEFAZOLIN SODIUM-DEXTROSE 2-4 GM/100ML-% IV SOLN
INTRAVENOUS | Status: AC
  Filled 2022-04-22: qty 100

## 2022-04-22 MED ORDER — SUCCINYLCHOLINE CHLORIDE 200 MG/10ML IV SOSY
PREFILLED_SYRINGE | INTRAVENOUS | Status: AC
  Filled 2022-04-22: qty 10

## 2022-04-22 MED ORDER — EPHEDRINE 5 MG/ML INJ
INTRAVENOUS | Status: AC
  Filled 2022-04-22: qty 5

## 2022-04-22 MED ORDER — OXYCODONE HCL 5 MG PO TABS
10.0000 mg | ORAL_TABLET | ORAL | Status: DC | PRN
  Administered 2022-04-23 (×2): 10 mg via ORAL
  Filled 2022-04-22 (×3): qty 2

## 2022-04-22 MED ORDER — PHENYLEPHRINE HCL-NACL 20-0.9 MG/250ML-% IV SOLN
INTRAVENOUS | Status: DC | PRN
  Administered 2022-04-22: 25 ug/min via INTRAVENOUS

## 2022-04-22 MED ORDER — BUPIVACAINE LIPOSOME 1.3 % IJ SUSP
INTRAMUSCULAR | Status: DC | PRN
  Administered 2022-04-22: 20 mL

## 2022-04-22 MED ORDER — CYCLOBENZAPRINE HCL 10 MG PO TABS
10.0000 mg | ORAL_TABLET | Freq: Three times a day (TID) | ORAL | Status: DC | PRN
  Administered 2022-04-23 – 2022-04-27 (×2): 10 mg via ORAL
  Filled 2022-04-22 (×2): qty 1

## 2022-04-22 MED ORDER — HYDROMORPHONE HCL 1 MG/ML IJ SOLN
0.2500 mg | INTRAMUSCULAR | Status: DC | PRN
  Administered 2022-04-22 (×2): 0.5 mg via INTRAVENOUS

## 2022-04-22 MED ORDER — CEFAZOLIN SODIUM-DEXTROSE 2-4 GM/100ML-% IV SOLN
2.0000 g | INTRAVENOUS | Status: AC
  Administered 2022-04-22: 2 g via INTRAVENOUS

## 2022-04-22 MED ORDER — SODIUM CHLORIDE 0.9% FLUSH
3.0000 mL | INTRAVENOUS | Status: DC | PRN
Start: 2022-04-22 — End: 2022-04-28

## 2022-04-22 MED ORDER — CHLORHEXIDINE GLUCONATE CLOTH 2 % EX PADS: 6.0000 | MEDICATED_PAD | Freq: Once | CUTANEOUS | Status: DC

## 2022-04-22 MED ORDER — POLYETHYLENE GLYCOL 3350 17 G PO PACK: 17.0000 g | PACK | Freq: Every day | ORAL | Status: DC | PRN

## 2022-04-22 MED ORDER — PHENYLEPHRINE 80 MCG/ML (10ML) SYRINGE FOR IV PUSH (FOR BLOOD PRESSURE SUPPORT)
PREFILLED_SYRINGE | INTRAVENOUS | Status: AC
  Filled 2022-04-22: qty 20

## 2022-04-22 MED ORDER — ONDANSETRON HCL 4 MG PO TABS: 4.0000 mg | ORAL_TABLET | Freq: Four times a day (QID) | ORAL | Status: DC | PRN

## 2022-04-22 MED ORDER — PROPOFOL 10 MG/ML IV BOLUS
INTRAVENOUS | Status: DC | PRN
  Administered 2022-04-22: 40 mg via INTRAVENOUS
  Administered 2022-04-22: 50 mg via INTRAVENOUS
  Administered 2022-04-22: 110 mg via INTRAVENOUS

## 2022-04-22 MED ORDER — BACITRACIN ZINC 500 UNIT/GM EX OINT
TOPICAL_OINTMENT | CUTANEOUS | Status: AC
  Filled 2022-04-22: qty 28.35

## 2022-04-22 MED ORDER — FENTANYL CITRATE (PF) 250 MCG/5ML IJ SOLN
INTRAMUSCULAR | Status: DC | PRN
  Administered 2022-04-22: 100 ug via INTRAVENOUS
  Administered 2022-04-22 (×3): 50 ug via INTRAVENOUS

## 2022-04-22 MED ORDER — BUPIVACAINE-EPINEPHRINE (PF) 0.5% -1:200000 IJ SOLN
INTRAMUSCULAR | Status: AC
  Filled 2022-04-22: qty 30

## 2022-04-22 MED ORDER — HYDRALAZINE HCL 20 MG/ML IJ SOLN
5.0000 mg | Freq: Once | INTRAMUSCULAR | Status: AC
Start: 2022-04-22 — End: 2022-04-22
  Administered 2022-04-22: 5 mg via INTRAVENOUS

## 2022-04-22 MED ORDER — PROPOFOL 10 MG/ML IV BOLUS
INTRAVENOUS | Status: AC
  Filled 2022-04-22: qty 20

## 2022-04-22 MED ORDER — VANCOMYCIN HCL 1000 MG IV SOLR
INTRAVENOUS | Status: DC | PRN
  Administered 2022-04-22: 1000 mg via TOPICAL

## 2022-04-22 MED ORDER — ALBUMIN HUMAN 5 % IV SOLN: INTRAVENOUS | Status: DC | PRN

## 2022-04-22 MED ORDER — ROCURONIUM BROMIDE 10 MG/ML (PF) SYRINGE
PREFILLED_SYRINGE | INTRAVENOUS | Status: AC
  Filled 2022-04-22: qty 10

## 2022-04-22 MED ORDER — BUPIVACAINE HCL (PF) 0.5 % IJ SOLN
INTRAMUSCULAR | Status: DC | PRN
  Administered 2022-04-22: 30 mL

## 2022-04-22 MED ORDER — THROMBIN 5000 UNITS EX SOLR
CUTANEOUS | Status: AC
Start: 2022-04-22 — End: ?
  Filled 2022-04-22: qty 5000

## 2022-04-22 MED ORDER — ACETAMINOPHEN 500 MG PO TABS
ORAL_TABLET | ORAL | Status: AC
  Administered 2022-04-22: 1000 mg via ORAL
  Filled 2022-04-22: qty 2

## 2022-04-22 MED ORDER — SODIUM CHLORIDE (PF) 0.9 % IJ SOLN
INTRAMUSCULAR | Status: DC | PRN
  Administered 2022-04-22: 10 mL

## 2022-04-22 MED ORDER — LIDOCAINE-EPINEPHRINE 1 %-1:100000 IJ SOLN
INTRAMUSCULAR | Status: DC | PRN
  Administered 2022-04-22: 9 mL

## 2022-04-22 MED ORDER — OXYCODONE HCL 5 MG PO TABS
5.0000 mg | ORAL_TABLET | ORAL | Status: DC | PRN
  Administered 2022-04-24 – 2022-04-28 (×3): 5 mg via ORAL
  Filled 2022-04-22 (×3): qty 1

## 2022-04-22 MED ORDER — DOCUSATE SODIUM 100 MG PO CAPS
100.0000 mg | ORAL_CAPSULE | Freq: Two times a day (BID) | ORAL | Status: DC
  Administered 2022-04-22 – 2022-04-28 (×12): 100 mg via ORAL
  Filled 2022-04-22 (×12): qty 1

## 2022-04-22 MED ORDER — IRBESARTAN 75 MG PO TABS
37.5000 mg | ORAL_TABLET | Freq: Every day | ORAL | Status: DC
  Administered 2022-04-23 – 2022-04-28 (×6): 37.5 mg via ORAL
  Filled 2022-04-22 (×6): qty 1

## 2022-04-22 MED ORDER — SODIUM CHLORIDE 0.9% FLUSH
3.0000 mL | Freq: Two times a day (BID) | INTRAVENOUS | Status: DC
  Administered 2022-04-23 – 2022-04-28 (×10): 3 mL via INTRAVENOUS

## 2022-04-22 MED ORDER — THROMBIN 5000 UNITS EX SOLR
OROMUCOSAL | Status: DC | PRN
  Administered 2022-04-22: 5 mL via TOPICAL

## 2022-04-22 MED ORDER — ROCURONIUM BROMIDE 10 MG/ML (PF) SYRINGE
PREFILLED_SYRINGE | INTRAVENOUS | Status: DC | PRN
  Administered 2022-04-22: 30 mg via INTRAVENOUS
  Administered 2022-04-22: 70 mg via INTRAVENOUS
  Administered 2022-04-22: 20 mg via INTRAVENOUS

## 2022-04-22 MED ORDER — ONDANSETRON HCL 4 MG/2ML IJ SOLN
INTRAMUSCULAR | Status: DC | PRN
  Administered 2022-04-22: 4 mg via INTRAVENOUS

## 2022-04-22 MED ORDER — 0.9 % SODIUM CHLORIDE (POUR BTL) OPTIME
TOPICAL | Status: DC | PRN
  Administered 2022-04-22: 1000 mL

## 2022-04-22 MED ORDER — LACTATED RINGERS IV SOLN: INTRAVENOUS | Status: DC | PRN

## 2022-04-22 MED ORDER — FINASTERIDE 5 MG PO TABS
5.0000 mg | ORAL_TABLET | Freq: Every day | ORAL | Status: DC
  Administered 2022-04-23 – 2022-04-28 (×6): 5 mg via ORAL
  Filled 2022-04-22 (×6): qty 1

## 2022-04-22 MED ORDER — LACTATED RINGERS IV SOLN: INTRAVENOUS | Status: DC

## 2022-04-22 MED ORDER — METOPROLOL SUCCINATE ER 25 MG PO TB24
25.0000 mg | ORAL_TABLET | Freq: Every day | ORAL | Status: DC
  Administered 2022-04-23 – 2022-04-28 (×6): 25 mg via ORAL
  Filled 2022-04-22 (×6): qty 1

## 2022-04-22 MED ORDER — CHLORHEXIDINE GLUCONATE 0.12 % MT SOLN: 15.0000 mL | Freq: Once | OROMUCOSAL | Status: AC

## 2022-04-22 MED ORDER — CEFAZOLIN SODIUM-DEXTROSE 1-4 GM/50ML-% IV SOLN
1.0000 g | Freq: Three times a day (TID) | INTRAVENOUS | Status: AC
  Administered 2022-04-22 – 2022-04-24 (×6): 1 g via INTRAVENOUS
  Filled 2022-04-22 (×6): qty 50

## 2022-04-22 MED ORDER — PHENOL 1.4 % MT LIQD
1.0000 | OROMUCOSAL | Status: DC | PRN
  Administered 2022-04-27: 1 via OROMUCOSAL
  Filled 2022-04-22: qty 177

## 2022-04-22 MED ORDER — LIDOCAINE-EPINEPHRINE 1 %-1:100000 IJ SOLN
INTRAMUSCULAR | Status: AC
  Filled 2022-04-22: qty 1

## 2022-04-22 MED ORDER — ORAL CARE MOUTH RINSE
15.0000 mL | Freq: Once | OROMUCOSAL | Status: AC
Start: 2022-04-22 — End: 2022-04-22

## 2022-04-22 MED ORDER — PHENYLEPHRINE 80 MCG/ML (10ML) SYRINGE FOR IV PUSH (FOR BLOOD PRESSURE SUPPORT)
PREFILLED_SYRINGE | INTRAVENOUS | Status: DC | PRN
  Administered 2022-04-22 (×6): 80 ug via INTRAVENOUS

## 2022-04-22 MED ORDER — HYDRALAZINE HCL 20 MG/ML IJ SOLN
INTRAMUSCULAR | Status: AC
  Filled 2022-04-22: qty 1

## 2022-04-22 MED ORDER — EPHEDRINE SULFATE-NACL 50-0.9 MG/10ML-% IV SOSY
PREFILLED_SYRINGE | INTRAVENOUS | Status: DC | PRN
  Administered 2022-04-22 (×2): 5 mg via INTRAVENOUS

## 2022-04-22 MED ORDER — FLEET ENEMA 7-19 GM/118ML RE ENEM: 1.0000 | ENEMA | Freq: Once | RECTAL | Status: DC | PRN

## 2022-04-22 MED ORDER — LIDOCAINE 2% (20 MG/ML) 5 ML SYRINGE
INTRAMUSCULAR | Status: AC
  Filled 2022-04-22: qty 5

## 2022-04-22 MED ORDER — VANCOMYCIN HCL 1000 MG IV SOLR
INTRAVENOUS | Status: AC
  Filled 2022-04-22: qty 20

## 2022-04-22 MED ORDER — HYDROMORPHONE HCL 1 MG/ML IJ SOLN
INTRAMUSCULAR | Status: AC
  Filled 2022-04-22: qty 1

## 2022-04-22 MED ORDER — FENTANYL CITRATE (PF) 250 MCG/5ML IJ SOLN
INTRAMUSCULAR | Status: AC
  Filled 2022-04-22: qty 5

## 2022-04-22 MED ORDER — BUPIVACAINE HCL (PF) 0.5 % IJ SOLN
INTRAMUSCULAR | Status: AC
Start: 2022-04-22 — End: ?
  Filled 2022-04-22: qty 30

## 2022-04-22 MED ORDER — ONDANSETRON HCL 4 MG/2ML IJ SOLN
INTRAMUSCULAR | Status: AC
Start: 2022-04-22 — End: ?
  Filled 2022-04-22: qty 2

## 2022-04-22 MED ORDER — HYDROMORPHONE HCL 1 MG/ML IJ SOLN: 0.5000 mg | INTRAMUSCULAR | Status: DC | PRN

## 2022-04-22 MED ORDER — CHLORHEXIDINE GLUCONATE 0.12 % MT SOLN
OROMUCOSAL | Status: AC
  Administered 2022-04-22: 15 mL via OROMUCOSAL
  Filled 2022-04-22: qty 15

## 2022-04-22 MED ORDER — AMIODARONE HCL 200 MG PO TABS
100.0000 mg | ORAL_TABLET | Freq: Every day | ORAL | Status: DC
  Administered 2022-04-23 – 2022-04-28 (×6): 100 mg via ORAL
  Filled 2022-04-22 (×6): qty 1

## 2022-04-22 MED ORDER — TAMSULOSIN HCL 0.4 MG PO CAPS
0.4000 mg | ORAL_CAPSULE | Freq: Every day | ORAL | Status: DC
  Administered 2022-04-23 – 2022-04-28 (×6): 0.4 mg via ORAL
  Filled 2022-04-22 (×6): qty 1

## 2022-04-22 MED ORDER — POTASSIUM CHLORIDE IN NACL 20-0.9 MEQ/L-% IV SOLN
INTRAVENOUS | Status: DC
  Filled 2022-04-22 (×2): qty 1000

## 2022-04-22 MED ORDER — CALCITRIOL 0.25 MCG PO CAPS
0.2500 ug | ORAL_CAPSULE | ORAL | Status: DC
  Administered 2022-04-24 – 2022-04-27 (×2): 0.25 ug via ORAL
  Filled 2022-04-22 (×2): qty 1

## 2022-04-22 MED ORDER — BUPIVACAINE LIPOSOME 1.3 % IJ SUSP
INTRAMUSCULAR | Status: AC
  Filled 2022-04-22: qty 20

## 2022-04-22 MED ORDER — ACETAMINOPHEN 325 MG PO TABS
650.0000 mg | ORAL_TABLET | ORAL | Status: DC | PRN
  Administered 2022-04-24 – 2022-04-27 (×7): 650 mg via ORAL
  Filled 2022-04-22 (×7): qty 2

## 2022-04-22 MED ORDER — LIDOCAINE 2% (20 MG/ML) 5 ML SYRINGE
INTRAMUSCULAR | Status: DC | PRN
  Administered 2022-04-22: 75 mg via INTRAVENOUS

## 2022-04-22 MED ORDER — BACITRACIN ZINC 500 UNIT/GM EX OINT
TOPICAL_OINTMENT | CUTANEOUS | Status: DC | PRN
  Administered 2022-04-22: 1 via TOPICAL

## 2022-04-22 MED ORDER — GLYCOPYRROLATE PF 0.2 MG/ML IJ SOSY
PREFILLED_SYRINGE | INTRAMUSCULAR | Status: AC
  Filled 2022-04-22: qty 1

## 2022-04-22 MED ORDER — ONDANSETRON HCL 4 MG/2ML IJ SOLN: 4.0000 mg | Freq: Four times a day (QID) | INTRAMUSCULAR | Status: DC | PRN

## 2022-04-22 MED ORDER — PANTOPRAZOLE SODIUM 40 MG PO TBEC
40.0000 mg | DELAYED_RELEASE_TABLET | Freq: Every day | ORAL | Status: DC
  Administered 2022-04-23 – 2022-04-28 (×6): 40 mg via ORAL
  Filled 2022-04-22 (×6): qty 1

## 2022-04-22 MED ORDER — SODIUM CHLORIDE 0.9 % IV SOLN
250.0000 mL | INTRAVENOUS | Status: DC
  Administered 2022-04-22: 250 mL via INTRAVENOUS

## 2022-04-22 MED ORDER — ACETAMINOPHEN 500 MG PO TABS: 1000.0000 mg | ORAL_TABLET | Freq: Once | ORAL | Status: AC

## 2022-04-22 MED ORDER — MENTHOL 3 MG MT LOZG
1.0000 | LOZENGE | OROMUCOSAL | Status: DC | PRN
Start: 2022-04-22 — End: 2022-04-28
  Filled 2022-04-22: qty 9

## 2022-04-22 MED ORDER — ACETAMINOPHEN 650 MG RE SUPP: 650.0000 mg | RECTAL | Status: DC | PRN

## 2022-04-22 SURGICAL SUPPLY — 84 items
BAG COUNTER SPONGE SURGICOUNT (BAG) ×1 IMPLANT
BAND RUBBER #18 3X1/16 STRL (MISCELLANEOUS) IMPLANT
BENZOIN TINCTURE PRP APPL 2/3 (GAUZE/BANDAGES/DRESSINGS) ×1 IMPLANT
BIT DRILL NEURO 2X3.1 SFT TUCH (MISCELLANEOUS) ×1 IMPLANT
BIT DRILL WIRE PASS 1.3MM (BIT) IMPLANT
BLADE BONE MILL MEDIUM (MISCELLANEOUS) IMPLANT
BLADE CLIPPER SURG (BLADE) IMPLANT
BUR 14 MATCH 3 (BUR) IMPLANT
BUR 15 MATCH 2.2 (BUR) IMPLANT
BUR MR8 14 BALL 5 (BUR) IMPLANT
BURR 14 MATCH 3 (BUR) ×1
BURR 15 MATCH 2.2 (BUR) ×1
BURR MR8 14 BALL 5 (BUR)
CANISTER SUCT 3000ML PPV (MISCELLANEOUS) ×1 IMPLANT
COVER BACK TABLE 60X90IN (DRAPES) ×3 IMPLANT
COVERAGE SUPPORT O-ARM STEALTH (MISCELLANEOUS) ×1 IMPLANT
DERMABOND ADVANCED (GAUZE/BANDAGES/DRESSINGS) ×1
DERMABOND ADVANCED .7 DNX12 (GAUZE/BANDAGES/DRESSINGS) IMPLANT
DRAIN JACKSON PRT FLT 7MM (DRAIN) IMPLANT
DRAPE C-ARM 42X72 X-RAY (DRAPES) ×2 IMPLANT
DRAPE HALF SHEET 40X57 (DRAPES) IMPLANT
DRAPE HALF SHEET 70X43 (DRAPES) IMPLANT
DRAPE LAPAROTOMY 100X72 PEDS (DRAPES) ×1 IMPLANT
DRAPE MICROSCOPE SLANT 54X150 (MISCELLANEOUS) IMPLANT
DRAPE SCAN PATIENT (DRAPES) ×1 IMPLANT
DRAPE SURG 17X23 STRL (DRAPES) ×1 IMPLANT
DRILL NEURO 2X3.1 SOFT TOUCH (MISCELLANEOUS)
DRILL WIRE PASS 1.3MM (BIT)
DRSG OPSITE POSTOP 4X6 (GAUZE/BANDAGES/DRESSINGS) ×1 IMPLANT
DRSG OPSITE POSTOP 4X8 (GAUZE/BANDAGES/DRESSINGS) IMPLANT
DURAPREP 26ML APPLICATOR (WOUND CARE) ×1 IMPLANT
ELECT COATED BLADE 2.86 ST (ELECTRODE) ×1 IMPLANT
ELECT REM PT RETURN 9FT ADLT (ELECTROSURGICAL) ×1
ELECTRODE REM PT RTRN 9FT ADLT (ELECTROSURGICAL) ×1 IMPLANT
EVACUATOR SILICONE 100CC (DRAIN) IMPLANT
FEE COVERAGE SUPPORT O-ARM (MISCELLANEOUS) IMPLANT
GAUZE 4X4 16PLY ~~LOC~~+RFID DBL (SPONGE) IMPLANT
GLOVE BIOGEL PI IND STRL 7.5 (GLOVE) ×2 IMPLANT
GLOVE ECLIPSE 7.5 STRL STRAW (GLOVE) ×1 IMPLANT
GLOVE EXAM NITRILE XL STR (GLOVE) IMPLANT
GLOVE SURG ENC MOIS LTX SZ8 (GLOVE) ×1 IMPLANT
GLOVE SURG UNDER POLY LF SZ8.5 (GLOVE) ×1 IMPLANT
GOWN STRL REUS W/ TWL LRG LVL3 (GOWN DISPOSABLE) ×2 IMPLANT
GOWN STRL REUS W/ TWL XL LVL3 (GOWN DISPOSABLE) ×2 IMPLANT
GOWN STRL REUS W/TWL 2XL LVL3 (GOWN DISPOSABLE) IMPLANT
GOWN STRL REUS W/TWL LRG LVL3 (GOWN DISPOSABLE) ×2
GOWN STRL REUS W/TWL XL LVL3 (GOWN DISPOSABLE) ×2
HEMOSTAT POWDER KIT SURGIFOAM (HEMOSTASIS) ×1 IMPLANT
KIT BASIN OR (CUSTOM PROCEDURE TRAY) ×1 IMPLANT
KIT TURNOVER KIT B (KITS) ×1 IMPLANT
MARKER SPHERE PSV REFLC NDI (MISCELLANEOUS) ×5 IMPLANT
MILL BONE PREP (MISCELLANEOUS) IMPLANT
NDL HYPO 21X1.5 SAFETY (NEEDLE) IMPLANT
NDL SPNL 18GX3.5 QUINCKE PK (NEEDLE) IMPLANT
NEEDLE HYPO 21X1.5 SAFETY (NEEDLE) ×1 IMPLANT
NEEDLE HYPO 22GX1.5 SAFETY (NEEDLE) ×1 IMPLANT
NEEDLE SPNL 18GX3.5 QUINCKE PK (NEEDLE) ×1 IMPLANT
NS IRRIG 1000ML POUR BTL (IV SOLUTION) ×1 IMPLANT
PACK LAMINECTOMY NEURO (CUSTOM PROCEDURE TRAY) ×1 IMPLANT
PAD ARMBOARD 7.5X6 YLW CONV (MISCELLANEOUS) ×3 IMPLANT
PIN MAYFIELD SKULL DISP (PIN) ×1 IMPLANT
PUTTY BONE 100 VESUVIUS 2.5CC (Putty) IMPLANT
ROD YUKON CONT 3.5X65 (Rod) IMPLANT
ROD YUKON CONTOURED O 3.5X55 (Rod) IMPLANT
SCREW PA YUKON 3.5X24 (Screw) IMPLANT
SCREW SET SPINAL YUKON (Set) IMPLANT
SCREW SPIN YUKON POLY 03.5X14 (Screw) IMPLANT
SCREW YUKON POLY 03.5X16 (Screw) IMPLANT
SPIKE FLUID TRANSFER (MISCELLANEOUS) ×1 IMPLANT
SPONGE NEURO XRAY DETECT 1X3 (DISPOSABLE) IMPLANT
SPONGE SURGIFOAM ABS GEL SZ50 (HEMOSTASIS) IMPLANT
SPONGE T-LAP 4X18 ~~LOC~~+RFID (SPONGE) IMPLANT
STRIP CLOSURE SKIN 1/2X4 (GAUZE/BANDAGES/DRESSINGS) ×1 IMPLANT
SUPPORT TECH COVERAGE MED NAV (MISCELLANEOUS) ×1
SUT MNCRL AB 4-0 PS2 18 (SUTURE) ×1 IMPLANT
SUT VIC AB 0 CT1 18XCR BRD8 (SUTURE) ×1 IMPLANT
SUT VIC AB 0 CT1 8-18 (SUTURE) ×2
SUT VIC AB 2-0 CP2 18 (SUTURE) ×1 IMPLANT
SYR 20ML LL LF (SYRINGE) IMPLANT
SYR 3ML LL SCALE MARK (SYRINGE) IMPLANT
TAP NAV STEALTH STR 3.0 DISP (TAP) IMPLANT
TOWEL GREEN STERILE (TOWEL DISPOSABLE) ×1 IMPLANT
TOWEL GREEN STERILE FF (TOWEL DISPOSABLE) ×1 IMPLANT
WATER STERILE IRR 1000ML POUR (IV SOLUTION) ×1 IMPLANT

## 2022-04-22 NOTE — Transfer of Care (Signed)
Immediate Anesthesia Transfer of Care Note  Patient: Mario [name]  Procedure(s) Performed: POSTERIOR CERVICAL FUSION  WITH LATERAL MASS FIXATION CERVICAL TWO-THREE, CERVICAL THREE-FOUR, CERVICAL FOUR-FIVE Application of O-Arm  Patient Location: PACU  Anesthesia Type:General  Level of Consciousness: alert , oriented and confused  Airway & Oxygen Therapy: Patient Spontanous Breathing and Patient connected to nasal cannula oxygen  Post-op Assessment: Report given to RN, Post -op Vital signs reviewed and stable and Patient moving all extremities X 4  Post vital signs: Reviewed and stable  Last Vitals:  Vitals Value Taken Time  BP 136/98 04/22/22 1818  Temp    Pulse 79 04/22/22 1822  Resp 19 04/22/22 1822  SpO2 100 % 04/22/22 1822  Vitals shown include unvalidated device data.  Last Pain:  Vitals:   04/22/22 1245  TempSrc:   PainSc: 0-No pain         Complications: No notable events documented.

## 2022-04-22 NOTE — Progress Notes (Signed)
Orthopedic Tech Progress Note Patient Details:  Mario [name] 09-13-1950 [id number]  Ortho Devices Type of Ortho Device: Soft collar Ortho Device/Splint Location: Neck Ortho Device/Splint Interventions: Ordered, Application, Adjustment   Post Interventions Patient Tolerated: Well Instructions Provided: Adjustment of device, Care of device  Georg Ruddle 04/22/2022, 6:44 PM

## 2022-04-22 NOTE — Op Note (Signed)
PREOP DIAGNOSIS: Cervical stenosis with myelopathy  POSTOP DIAGNOSIS: Cervical stenosis with myelopathy   PROCEDURE: 1. Posterior arthrodesis C2-3 2.  Posterior arthrodesis, additional levels C3-4, C4-5 3. Laminectomy and foraminotomies, C2-3, C3-4, C4-5 4. Segmental instrumentation with lateral mass/pedicle screw and rod construct, C2-3-4-5 5. Harvest of local autograft 6. Use of morselized bone allograft  7.  Intraoperative neuronavigation with Stealth, intraoperative CT with O-arm   SURGEON: Dr. Hoyt Koch, MD  ASSISTANT: Docia Barrier, NP.  Please note, no qualified trainees were available to assist with the procedure.  Assistance was required for retraction of the visceral structures to safely allow for instrumentation.  ANESTHESIA: General Endotracheal  EBL: 100 ml  IMPLANTS: Stryker Screws 3.5 x 24 mm at C2 bilaterally  3.5 x 14 mm at C4, C5, and left C3, 3.5 x 16 mm at right C3 65 mm/55 mm rod   SPECIMENS: None  DRAINS:10 flat JP drain  COMPLICATIONS: None immediate  CONDITION: Hemodynamically stable to PACU  HISTORY: Mario [name] is a 71 y.o. y.o. male who presented with cervical myeloradiculopathy, was found to have severe cervical stenosis.  .  Risks, benefits, alternatives, and expected convalescence were discussed with the patient.  Risks discussed included but were not limited to bleeding, pain, infection, pseudoarthrosis, hardware failure, adjacent segment disease, CSF leak, neurologic deficits, weakness, numbness, paralysis, coma, and death. After all questions were answered, informed consent was obtained.  PROCEDURE IN DETAIL: The patient was brought to the operating room and transferred to the operative table. After induction of general anesthesia, patient's head was placed in a Mayfield head holder and the patient was positioned prone on the operative table with all pressure points meticulously padded and secured in neutral position. The skin of  the posterior neck was then prepped and draped in the usual sterile fashion.  After timeout was conducted, the skin was infiltrated with local anesthetic. Skin incision was then made sharply and Bovie electrocautery was used to dissect the subcutaneous tissue and sharply opened the fascia.  Paraspinous muscles were dissected off from C2, C3, C4, and C5 lamina and lateral masses in subperiosteal fashion.  Self-retaining retractor was used.  Registration clamp was placed on the C2 spinous process and intraoperative CT was performed, with registration with Stealth. Using navigated drills pilot holes for left C2 pedicle and right C2 pars screws were drilled and then tapped.  Ball ended feeler confirmed good cannulation.  3.5 x 24 mm screws were then placed.  For lateral mass screws, Pilot holes were drilled.  The holes were then tapped with navigation with ball ended feeler confirming good cannulation. Inferior laminectomy of C2, superior laminectomy of C5, and en bloc laminectomies at C3 and C4 were then performed by drilling troughs at the junction of the lateral mass and lamina bilaterally and removing intervening ligamentum flavum  This bone was harvested for autograft.  Additional foraminotomies were performed at C2-3, C3-4, and C4-5.  Decompression was confirmed with easy passage of nerve hook.  Meticulous epidural hemostasis obtained.  Lateral mass screws were then placed using the Magerl technique through the previously tapped holes.   The lateral masses and transverse process were then decorticated including the joint spaces.  Rod was then placed in the tulip head and secured with screw caps and final tightened.  Final x-ray confirmed appropriate instrumentation placement.  The wound was irrigated thoroughly with antibiotic impregnated irrigation.  Autograft mixed with allograft was placed in the lateral gutters bilaterally.  A 7 flat JP drain was placed  in the subfascial space and tunneled out the skin and  secured with a stitch.  Exparel was injected into the paraspinous muscles and vancomycin powder was placed in the wound.  The muscle layer was closed with 0 Vicryl stitches.  The fascia was closed with 0 Vicryl stitches.  The subcutaneous layer was closed with 0 Vicryl stitches.  The dermal layer was closed with 2-0 Vicryl stitches in buried fashion.  Skin was closed with staples.  A sterile dressing was then placed patient was then removed from Mayfield head holder and flipped supine and extubated by the anesthesia service.  All counts were correct at the end of surgery.  No complications were noted.

## 2022-04-22 NOTE — Anesthesia Procedure Notes (Signed)
Procedure Name: Intubation Date/Time: 04/22/2022 2:34 PM  Performed by: Waynard Edwards, CRNAPre-anesthesia Checklist: Patient identified, Emergency Drugs available, Suction available and Patient being monitored Patient Re-evaluated:Patient Re-evaluated prior to induction Oxygen Delivery Method: Circle System Utilized Preoxygenation: Pre-oxygenation with 100% oxygen Induction Type: IV induction Ventilation: Mask ventilation without difficulty and Oral airway inserted - appropriate to patient size Laryngoscope Size: Hyacinth Meeker and 3 Grade View: Grade I Tube type: Oral Tube size: 7.5 mm Number of attempts: 1 Airway Equipment and Method: Stylet and Oral airway Placement Confirmation: ETT inserted through vocal cords under direct vision, positive ETCO2 and breath sounds checked- equal and bilateral Secured at: 22 cm Tube secured with: Tape Dental Injury: Teeth and Oropharynx as per pre-operative assessment

## 2022-04-22 NOTE — Anesthesia Procedure Notes (Signed)
Arterial Line Insertion Start/End9/01/2022 1:15 PM Performed by: Audie Pinto, CRNA, CRNA  Patient location: Pre-op. Preanesthetic checklist: patient identified, IV checked and risks and benefits discussed Lidocaine 1% used for infiltration Left, radial was placed Catheter size: 20 G Hand hygiene performed  and maximum sterile barriers used   Attempts: 2 Procedure performed without using ultrasound guided technique. Following insertion, Biopatch and dressing applied. Post procedure assessment: unchanged  Patient tolerated the procedure well with no immediate complications.

## 2022-04-22 NOTE — H&P (Signed)
CC: cervical stenosis  HPI:    This is a 71 year old man with CHF, AFib on Eliquis, CKI, disabled since birth who developed neck pain with occasional radiation down his right and left arm, as well as hand numbness and weakness.  He was found to have severe cervical stenosis with cord impingement. He is a Clinical research associate.    Patient Active Problem List   Diagnosis Date Noted   Anemia 07/01/2021   Encounter for screening colonoscopy 07/01/2021   Past Medical History:  Diagnosis Date   Anemia    Arthritis    Atrial fibrillation (HCC)    CHF (congestive heart failure) (HCC)    CKD (chronic kidney disease)    Dyspnea    r/t heart failure   Dysrhythmia    A.Fib/A.Flutter   HTN (hypertension)    PE (pulmonary thromboembolism) (HCC) 10/17/2019   acute PE in setting of new onset aflutter    Past Surgical History:  Procedure Laterality Date   ANKLE SURGERY Left    tumor removed from left ankle per patient   CARDIOVERSION  2021   COLONOSCOPY WITH PROPOFOL N/A 07/21/2021   poor prep   ESOPHAGOGASTRODUODENOSCOPY (EGD) WITH PROPOFOL N/A 07/21/2021   one gastric polyp s/p resection and retrieval, normal duodenum   FOOT SURGERY     none     POLYPECTOMY  07/21/2021   Procedure: POLYPECTOMY;  Surgeon: Lanelle Bal, DO;  Location: [facility];  Service: Endoscopy;;    Medications Prior to Admission  Medication Sig Dispense Refill Last Dose   acetaminophen (TYLENOL) 325 MG tablet Take 650 mg by mouth every 6 (six) hours as needed for moderate pain.   Past Week   amiodarone (PACERONE) 200 MG tablet Take 100 mg by mouth daily.   04/22/2022 at 0630   amLODipine (NORVASC) 10 MG tablet Take 10 mg by mouth daily.   04/22/2022 at 0630   calcitRIOL (ROCALTROL) 0.25 MCG capsule Take 0.25 mcg by mouth every Monday, Wednesday, and Friday.   04/22/2022 at 0630   ELIQUIS 5 MG TABS tablet Take 5 mg by mouth 2 (two) times daily.   04/19/2022   finasteride (PROSCAR) 5 MG tablet Take 5 mg by mouth daily.    04/22/2022 at 0630   furosemide (LASIX) 20 MG tablet Take 20 mg by mouth daily.   04/22/2022 at 0630   ibuprofen (ADVIL) 200 MG tablet Take 400 mg by mouth every 6 (six) hours as needed for moderate pain.   Past Week   metoprolol succinate (TOPROL-XL) 25 MG 24 hr tablet Take 25 mg by mouth daily.   04/22/2022 at 0630   pantoprazole (PROTONIX) 40 MG tablet Take 40 mg by mouth daily.   04/22/2022 at 0630   tamsulosin (FLOMAX) 0.4 MG CAPS capsule Take 0.4 mg by mouth daily.   04/22/2022 at 0630   valsartan (DIOVAN) 40 MG tablet Take 20 mg by mouth daily.   04/22/2022 at 0630   No Known Allergies  Social History   Tobacco Use   Smoking status: Former    Types: Cigarettes   Smokeless tobacco: Former    Types: Chew  Substance Use Topics   Alcohol use: Not Currently    Family History  Problem Relation Age of Onset   Cancer Sister        unknown type   Colon cancer Neg Hx    Colon polyps Neg Hx      Review of Systems Pertinent items are noted in HPI.  Objective:  Patient Vitals for the past 8 hrs:  BP Temp Temp src Pulse Resp SpO2 Height Weight  04/22/22 1227 (!) 153/93 98.1 F (36.7 C) Oral (!) 57 17 95 % 5\' 8"  (1.727 m) 74.8 kg   No intake/output data recorded. No intake/output data recorded.      General : Alert, cooperative, no distress, appears stated age   Head:  Normocephalic/atraumatic    Eyes: PERRL, conjunctiva/corneas clear, EOM's intact. Fundi could not be visualized Neck: Supple Chest:  Respirations unlabored Chest wall: no tenderness or deformity Abdomen: Soft, nontender and nondistended Extremities: warm and well-perfused Skin: normal turgor, color and texture Neurologic:  Alert, oriented x 3.  Eyes open spontaneously. PERRL. Stooped posture, hips retroflex.  Right foot everted.  Positive Hoffmann's bilaterally.  Two beats clonus in both feet.  3+ biceps, triceps, brachioradialis.  Positive dysarthria.       Data ReviewCBC:  Lab Results  Component Value Date    WBC 6.6 07/17/2021   RBC 4.02 (L) 07/17/2021   BMP:  Lab Results  Component Value Date   GLUCOSE 96 07/17/2021   CO2 24 07/17/2021   BUN 20 07/17/2021   CREATININE 1.41 (H) 07/17/2021   CALCIUM 9.0 07/17/2021   Radiology review:  MRI cervical spine without contrast was reviewed.  This shows severe cervical stenosis at C3-4 and C4-5 with moderate to severe stenosis at C2-3.  There is early cord signal change at C2-3 and C3-4 though no apparent myelomalacia.  Assessment:   Active Problems:   * No active hospital problems. *  This is a 71 year old man with severe cervical stenosis with myelopathy as well as severe lumbar stenosis with coronal deformity and spondylolisthesis.    Plan:   I had a long discussion with the patient.  Given his significant cord impingement and progressive symptomatology, I do recommend cervical spine surgery as it would give him the most optimal functional outcome.  He has severe lumbar stenosis that may need to be addressed as well but I recommend addressing his cervical spine disease first with C2-5 PCDF.  I discussed the general technique of surgery again with him as well as risks, benefits, alternatives, expected convalescence.  Risks discussed included, but were not limited to, bleeding, pain, infection, scar, pseudoarthrosis, adjacent segment disease, neurologic deficit, spinal fluid leak, stroke, and death.  All questions and concerns were answered.  He verbalized understanding and agreement with the plan.

## 2022-04-23 NOTE — Evaluation (Signed)
Occupational Therapy Evaluation Patient Details Name: Mario [name] MRN: [id number] DOB: 1951-06-23 Today's Date: 04/23/2022   History of Present Illness 71 yo male presenting 9/6 with cervical stenosis with cord impingement, now s/p posterior arthrodesis of C2-3, C3-4, and C4-5 with laminectomy and segmental instrumentation. PMH including arthritis, CHF, CKD, a-fib, HTN, PE, and L ankle sx.   Clinical Impression   PTA, pt reports he was independent and lived alone; has a friend who "cant check in but I dont need it." Pt currently requiring Min-Mod A for UB ADLs, Mod-Max A for LB ADLs, and Min A for functional mobility with RW. Pt demonstrating poor safety, adherence to precautions, and understanding of need for precautions. Pt stating (when discussing falling at home), "oh Ill be fine, I'll just get back up." Therapist pointing out that a fall could result in a SCI and pt stating "I just won't fall." Pt would benefit from further acute OT to facilitate safe dc. Recommend dc to SNF for further OT to optimize safety, independence with ADLs, and return to PLOF.      Recommendations for follow up therapy are one component of a multi-disciplinary discharge planning process, led by the attending physician.  Recommendations may be updated based on patient status, additional functional criteria and insurance authorization.   Follow Up Recommendations  Skilled nursing-short term rehab (<3 hours/day)    Assistance Recommended at Discharge Frequent or constant Supervision/Assistance  Patient can return home with the following A little help with walking and/or transfers;A little help with bathing/dressing/bathroom;Assistance with cooking/housework;Direct supervision/assist for medications management;Direct supervision/assist for financial management;Assist for transportation    Functional Status Assessment  Patient has had a recent decline in their functional status and demonstrates the ability to  make significant improvements in function in a reasonable and predictable amount of time.  Equipment Recommendations  BSC/3in1    Recommendations for Other Services       Precautions / Restrictions Precautions Precautions: Fall;Cervical Precaution Comments: Only recalling "can't reach up and can't drive my car" from PT session earlier. No adherance during therapy session and impulsive. Required Braces or Orthoses: Cervical Brace Cervical Brace: Soft collar Restrictions Weight Bearing Restrictions: No      Mobility Bed Mobility Overal bed mobility: [name] Assistance Bed Mobility: Supine to Sit     Supine to sit: Supervision     General bed mobility comments: No using log roll despite cues    Transfers Overall transfer level: [name] assistance Equipment used: Rolling walker (2 wheels) Transfers: Sit to/from Stand Sit to Stand: Min guard           General transfer comment: minG for safety with x2 posterior LOB with standing needing minA to steady.      Balance Overall balance assessment: [name] assistance, History of Falls Sitting-balance support: No upper extremity supported Sitting balance-[name]: Good     Standing balance support: Bilateral upper extremity supported, During functional activity, Reliant on assistive device for balance Standing balance-[name]: Poor Standing balance comment: BUE support with up to modA to walk in room                           ADL either performed or assessed with clinical judgement   ADL Overall ADL's : [name] assistance/impaired Eating/Feeding: Set up;Sitting   Grooming: Set up;Sitting   Upper Body Bathing: Minimal assistance;Sitting   Lower Body Bathing: Moderate assistance;Sit to/from stand   Upper Body Dressing : Moderate assistance;Sitting   Lower  Body Dressing: Maximal assistance;Sit to/from stand Lower Body Dressing Details (indicate cue type and reason): unable to perform figure four Toilet  Transfer: Minimal assistance;Ambulation;Rolling walker (2 wheels);Min guard           Functional mobility during ADLs: Minimal assistance;Rolling walker (2 wheels) General ADL Comments: Poor adherance to precautions. Difficulty understanding how bending forward would place pressure and strain on the neck.     Vision         Perception     Praxis      Pertinent Vitals/Pain Pain Assessment Pain Assessment: No/denies pain     Hand Dominance Right   Extremity/Trunk Assessment Upper Extremity Assessment Upper Extremity Assessment: Generalized weakness   Lower Extremity Assessment Lower Extremity Assessment: Defer to PT evaluation   Cervical / Trunk Assessment Cervical / Trunk Assessment: Kyphotic;Neck Surgery   Communication Communication Communication: No difficulties   Cognition Arousal/Alertness: Awake/alert Behavior During Therapy: Impulsive Overall Cognitive Status: Impaired/Different from baseline Area of Impairment: Memory, Following commands, Safety/judgement, Awareness, Problem solving                     Memory: Decreased recall of precautions, Decreased short-term memory Following Commands: Follows one step commands inconsistently (potentially due to impulsivity) Safety/Judgement: Decreased awareness of safety, Decreased awareness of deficits Awareness: Intellectual Problem Solving: Slow processing, Requires verbal cues General Comments: Poor awareness of defciits and safety. Despite cues, pt not following techniques to precautions.     General Comments  VSS    Exercises     Shoulder Instructions      Home Living Family/patient expects to be discharged to:: Private residence Living Arrangements: Alone Available Help at Discharge: Other (Comment) (paramedic checks on him ~1x/week but PRN) Type of Home: House Home Access: Stairs to enter [employer] of Steps: 1 Entrance Stairs-Rails: None Home Layout: Other (Comment) (1 "large  step" from kitchen to living area)     Bathroom Shower/Tub: Sponge bathes at baseline (no bathroom in house, gets water from well to sponge bathe)   Bathroom Toilet: Standard (outhouse, no running water) Bathroom Accessibility: No   Home Equipment: Rollator (4 wheels)   Additional Comments: pt lives alone with no support. no running water, no phone. does drive short distances (church across street to get drinking water), paramedic brings food the pt can heat up for meals      Prior Functioning/Environment Prior Level of Function : [name] assist;History of Falls (last six months);Driving             Mobility Comments: pt uses 4 wheel walker, reports no difficulites but falls ~1/day. pt reports he does not need help getting up but also has no way of calling for assist to get up. paramedic checks on him intermittently. pt still mows yard and does yard work ADLs Comments: pt reports independence, heats up meals brought by paramedic, does yard work. sponge bathes with well water. drives clothes to laundrymat.        OT Problem List: Decreased strength;Decreased range of motion;Decreased activity tolerance;Impaired balance (sitting and/or standing);Decreased knowledge of use of DME or AE;Decreased knowledge of precautions;Decreased cognition;Decreased safety awareness;Pain      OT Treatment/Interventions: Self-care/ADL training;Therapeutic exercise;Energy conservation;DME and/or AE instruction;Therapeutic activities;Patient/family education    OT Goals(Current goals can be found in the care plan section) Acute Rehab OT Goals Patient Stated Goal: Go home OT Goal Formulation: With patient Time For Goal Achievement: 05/07/22 Potential to Achieve Goals: Good  OT Frequency: Min 3X/week  Co-evaluation              AM-PAC OT "6 Clicks" Daily Activity     Outcome Measure Help from another person eating meals?: None Help from another person taking care of personal grooming?: A  Little Help from another person toileting, which includes using toliet, bedpan, or urinal?: A Lot Help from another person bathing (including washing, rinsing, drying)?: A Lot Help from another person to put on and taking off regular upper body clothing?: A Lot Help from another person to put on and taking off regular lower body clothing?: A Lot 6 Click Score: 15   End of Session Equipment Utilized During Treatment: Rolling walker (2 wheels);Gait belt;Cervical collar Nurse Communication: Mobility status  Activity Tolerance: Patient tolerated treatment well;Other (comment) (poor adherance to precautions) Patient left: in bed;with call bell/phone within reach;with bed alarm set  OT Visit Diagnosis: Unsteadiness on feet (R26.81);Other abnormalities of gait and mobility (R26.89);Muscle weakness (generalized) (M62.81);Pain;History of falling (Z91.81) Pain - part of body:  (neck)                Time: 1556-1610 OT Time Calculation (min): 14 min Charges:  OT General Charges $OT Visit: 1 Visit OT Evaluation $OT Eval Moderate Complexity: 1 Mod  Milca Sytsma MSOT, OTR/L Acute Rehab Office: [phone]  Theodoro Grist Nysa Sarin 04/23/2022, 6:15 PM

## 2022-04-23 NOTE — Progress Notes (Signed)
Subjective: Patient reports that he is doing well. Minimal surgical pain. NAE ON.  Objective: Vital signs in last 24 hours: Temp:  [97.4 F (36.3 C)-99.5 F (37.5 C)] 99.4 F (37.4 C) (09/07 0803) Pulse Rate:  [57-83] 67 (09/07 0803) Resp:  [14-22] 18 (09/07 0803) BP: (118-192)/(70-104) 164/96 (09/07 0803) SpO2:  [93 %-100 %] 97 % (09/07 0803) Arterial Line BP: (177-194)/(81-84) 194/84 (09/06 1845) Weight:  [74.8 kg] 74.8 kg (09/06 1227)  Intake/Output from previous day: 09/06 0701 - 09/07 0700 In: 2669 [P.O.:240; I.V.:1979; IV Piggyback:450] Out: 2260 [Urine:1895; Drains:115; Blood:250] Intake/Output this shift: No intake/output data recorded.  Physical Exam: Patient is awake, A/O X 4, conversant, and in good spirits. Eyes open spontaneously. They are in NAD and VSS. Doing well. Speech is fluent and appropriate. MAEW with good strength that is symmetric bilaterally.  BUE 5/5 throughout, BLE 5/5 throughout. Sensation to light touch is intact. PERLA, EOMI. CNs grossly intact. Dressing is dry and intact with a small amount of old drainage. Incision is well approximated with no drainage, erythema, or edema. JP drain with approximately 115 ml of serosanguineous drainage. Soft cervical collar in place   Lab Results: No results for input(s): "WBC", "HGB", "HCT", "PLT" in the last 72 hours. BMET No results for input(s): "NA", "K", "CL", "CO2", "GLUCOSE", "BUN", "CREATININE", "CALCIUM" in the last 72 hours.  Studies/Results: DG Cervical Spine 1 View  Result Date: 04/22/2022 CLINICAL DATA:  Fluoroscopic assistance for cervical spinal fusion EXAM: DG CERVICAL SPINE - 1 VIEW COMPARISON:  MR done on 07/15/2021 FINDINGS: Fluoroscopic image shows posterior surgical fusion from C2-C5 levels. Fluoroscopy time 4.8 seconds. Radiation dose 0.89 mGy. IMPRESSION: Fluoroscopic assistance was provided for posterior fusion from C2-C5 levels. Electronically Signed   By: Ernie Avena M.D.   On:  04/22/2022 17:33   DG C-Arm 1-60 Min-No Report  Result Date: 04/22/2022 Fluoroscopy was utilized by the requesting physician.  No radiographic interpretation.   DG O-ARM IMAGE ONLY/NO REPORT  Result Date: 04/22/2022 There is no Radiologist interpretation  for this exam.   Assessment/Plan: Patient is post-op day 1 s/p C2-5 PCDF. He is recovering well and currently has no complaints. His neuro exam is stable. He has not been out of  bed yet. Awaiting PT/OT evaluation. Continue soft cervical collar.  Drain will be removed this morning.  Continue working on pain control, mobility and ambulating patient. Will plan for discharge once he has had PT/OT evaluation and med with [facility] team.    LOS: 1 day     Council Mechanic, DNP, AGNP-C Neurosurgery Nurse Practitioner  [facility] [address], [address], [place], [place] [address] P: [phone]    F: [phone]  04/23/2022 8:29 AM

## 2022-04-23 NOTE — Anesthesia Postprocedure Evaluation (Signed)
Anesthesia Post Note  Patient: Mario [name]  Procedure(s) Performed: POSTERIOR CERVICAL FUSION  WITH LATERAL MASS FIXATION CERVICAL TWO-THREE, CERVICAL THREE-FOUR, CERVICAL FOUR-FIVE Application of O-Arm     Patient location during evaluation: PACU Anesthesia Type: General Level of consciousness: sedated and patient cooperative Pain management: pain level controlled Vital Signs Assessment: post-procedure vital signs reviewed and stable Respiratory status: spontaneous breathing Cardiovascular status: stable Anesthetic complications: no   No notable events documented.  Last Vitals:  Vitals:   04/23/22 0803 04/23/22 1138  BP: (!) 164/96 138/85  Pulse: 67 61  Resp: 18 18  Temp: 37.4 C 37.3 C  SpO2: 97% 98%    Last Pain:  Vitals:   04/23/22 1138  TempSrc: Oral  PainSc:                  Lewie Loron

## 2022-04-23 NOTE — Plan of Care (Signed)

## 2022-04-23 NOTE — Evaluation (Signed)
Physical Therapy Evaluation Patient Details Name: Mario [name] MRN: [id number] DOB: [birth year] Today's Date: 04/23/2022  History of Present Illness  The pt is a 71 yo male presenting 9/6 with cervical stenosis with cord impingement, now s/p posterior arthrodesis of C2-3, C3-4, and C4-5 with laminectomy and segmental instrumentation.   Clinical Impression  Pt in bed upon arrival of PT, agreeable to evaluation at this time. Prior to admission the pt was living alone with limited social support. He was mobilizing with use of a 4-wheel walker, but reports falling ~1x/day with no ability to call for assistance. The pt now presents with limitations in functional mobility, strength, power, endurance, dynamic stability, and safety awareness due to above dx, and will continue to benefit from skilled PT to address these deficits. The pt required multiple attempts to stand at times, but once standing was able to ambulate short distance with min-modA and BUE support on RW for short distance ambulation in the room. He required modA to correct LOB and max cues for direction, movement of RW, and line management. The pt presents with poor awareness of deficits, little insight to importance of fall risk reduction and maintaining precautions while healing, and has limited access to support if he were to d/c home. Will continue to benefit from skilled PT acutely as well as SNF rehab after d/c maximize functional recovery, safety with mobility, and to improve functional adherence to precautions after surgery.         Recommendations for follow up therapy are one component of a multi-disciplinary discharge planning process, led by the attending physician.  Recommendations may be updated based on patient status, additional functional criteria and insurance authorization.  Follow Up Recommendations Skilled nursing-short term rehab (<3 hours/day) Can patient physically be transported by private vehicle: Yes     Assistance Recommended at Discharge Frequent or constant Supervision/Assistance  Patient can return home with the following  A little help with walking and/or transfers;A little help with bathing/dressing/bathroom;Assistance with cooking/housework;Assistance with feeding;Direct supervision/assist for medications management;Direct supervision/assist for financial management;Assist for transportation;Help with stairs or ramp for entrance    Equipment Recommendations Rolling walker (2 wheels);BSC/3in1  Recommendations for Other Services       Functional Status Assessment Patient has had a recent decline in their functional status and demonstrates the ability to make significant improvements in function in a reasonable and predictable amount of time.     Precautions / Restrictions Precautions Precautions: Fall;Cervical Precaution Comments: discussed at length with pt, no carry over or ability to recall, pt reports he is able to complete all movements I instructed him to avoid Required Braces or Orthoses: Cervical Brace Cervical Brace: Soft collar Restrictions Weight Bearing Restrictions: No      Mobility  Bed Mobility Overal bed mobility: Needs Assistance Bed Mobility: Supine to Sit     Supine to sit: Supervision     General bed mobility comments: not following cues for mobility with cervical precautions. good strength to come to sitting without assist    Transfers Overall transfer level: Needs assistance Equipment used: Rolling walker (2 wheels) Transfers: Sit to/from Stand Sit to Stand: Min guard           General transfer comment: minG for safety with x2 posterior LOB with standing needing minA to steady.    Ambulation/Gait Ambulation/Gait assistance: Min assist, Mod assist Gait Distance (Feet): 12 Feet Assistive device: Rolling walker (2 wheels) Gait Pattern/deviations: Step-through pattern, Decreased stride length, Knee flexed in stance - right, Knee flexed in  stance - left, Trunk flexed Gait velocity: decreased but also impulsive for safety     General Gait Details: pt walks with RW far ahead of him, knees flexed and collapsing medially. quick and impulsive with mobility. poor awareness of lines or safety     Balance Overall balance assessment: Needs assistance, History of Falls Sitting-balance support: No upper extremity supported Sitting balance-[name]: Good     Standing balance support: Bilateral upper extremity supported, During functional activity, Reliant on assistive device for balance Standing balance-[name]: Poor Standing balance comment: BUE support with up to modA to walk in room                             Pertinent Vitals/Pain Pain Assessment Pain Assessment: No/denies pain    Home Living Family/patient expects to be discharged to:: Private residence Living Arrangements: Alone Available Help at Discharge: Other (Comment) (paramedic checks on him ~1x/week but PRN) Type of Home: House Home Access: Stairs to enter Entrance Stairs-Rails: None Entrance Stairs-Number of Steps: 1   Home Layout: Other (Comment) (1 "large step" from kitchen to living area) Home Equipment: Rollator (4 wheels) Additional Comments: pt lives alone with no support. no running water, no phone. does drive short distances (church across street to get drinking water), paramedic brings food the pt can heat up for meals    Prior Function Prior Level of Function : Needs assist;History of Falls (last six months);Driving             Mobility Comments: pt uses 4 wheel walker, reports no difficulites but falls ~1/day. pt reports he does not need help getting up but also has no way of calling for assist to get up. paramedic checks on him intermittently. pt still mows yard and does yard work ADLs Comments: pt reports independence, heats up meals brought by paramedic, does yard work. sponge bathes with well water. drives clothes to  laundrymat.     Hand Dominance   Dominant Hand: Right    Extremity/Trunk Assessment   Upper Extremity Assessment Upper Extremity Assessment: Defer to OT evaluation (chronically unable to reach overhead)    Lower Extremity Assessment Lower Extremity Assessment: Generalized weakness    Cervical / Trunk Assessment Cervical / Trunk Assessment: Kyphotic;Neck Surgery  Communication   Communication: No difficulties  Cognition Arousal/Alertness: Awake/alert Behavior During Therapy: Impulsive Overall Cognitive Status: Impaired/Different from baseline Area of Impairment: Memory, Following commands, Safety/judgement, Awareness, Problem solving                     Memory: Decreased recall of precautions, Decreased short-term memory Following Commands: Follows one step commands inconsistently (potentially due to impulsivity) Safety/Judgement: Decreased awareness of safety, Decreased awareness of deficits Awareness: Intellectual Problem Solving: Slow processing, Requires verbal cues General Comments: verbal cues for safety, poor awareness. pt insistant he is independent. remained impulsive and needs max cues.        General Comments General comments (skin integrity, edema, etc.): VSS on RA during session    Exercises     Assessment/Plan    PT Assessment Patient needs continued PT services  PT Problem List Decreased range of motion;Decreased activity tolerance;Decreased balance;Decreased strength;Decreased mobility;Decreased coordination;Decreased cognition;Decreased safety awareness       PT Treatment Interventions DME instruction;Gait training;Stair training;Functional mobility training;Therapeutic activities;Balance training;Therapeutic exercise;Neuromuscular re-education;Cognitive remediation;Patient/family education    PT Goals (Current goals can be found in the Care Plan section)  Acute Rehab PT Goals  Patient Stated Goal: to return to full independence PT Goal  Formulation: With patient Time For Goal Achievement: 05/07/22 Potential to Achieve Goals: Good    Frequency Min 5X/week        AM-PAC PT "6 Clicks" Mobility  Outcome Measure Help needed turning from your back to your side while in a flat bed without using bedrails?: A Little Help needed moving from lying on your back to sitting on the side of a flat bed without using bedrails?: A Little Help needed moving to and from a bed to a chair (including a wheelchair)?: A Little Help needed standing up from a chair using your arms (e.g., wheelchair or bedside chair)?: A Lot Help needed to walk in hospital room?: Total Help needed climbing 3-5 steps with a railing? : Total 6 Click Score: 13    End of Session Equipment Utilized During Treatment: Gait belt;Cervical collar Activity Tolerance: Patient tolerated treatment well Patient left: in chair;with call bell/phone within reach;with chair alarm set Nurse Communication: Mobility status PT Visit Diagnosis: Other abnormalities of gait and mobility (R26.89);Repeated falls (R29.6);Muscle weakness (generalized) (M62.81);History of falling (Z91.81)    Time: 2130-8657 PT Time Calculation (min) (ACUTE ONLY): 35 min   Charges:   PT Evaluation $PT Eval Moderate Complexity: 1 Mod PT Treatments $Therapeutic Activity: 8-22 mins        Vickki Muff, PT, DPT   Acute Rehabilitation Department  Ronnie Derby 04/23/2022, 12:23 PM

## 2022-04-24 ENCOUNTER — Encounter (HOSPITAL_COMMUNITY): Payer: Self-pay | Admitting: Neurosurgery

## 2022-04-24 NOTE — Plan of Care (Signed)

## 2022-04-24 NOTE — Progress Notes (Signed)
Subjective: Patient reports soreness in his posterior neck and upper back. NAE ON.  Objective: Vital signs in last 24 hours: Temp:  [98 F (36.7 C)-100.1 F (37.8 C)] 98 F (36.7 C) (09/08 0738) Pulse Rate:  [57-63] 61 (09/08 0738) Resp:  [17-26] 17 (09/08 0738) BP: (118-171)/(68-93) 118/68 (09/08 0738) SpO2:  [96 %-98 %] 96 % (09/08 0738)  Intake/Output from previous day: 09/07 0701 - 09/08 0700 In: 1826 [P.O.:1560; I.V.:16; IV Piggyback:250] Out: 2680 [Urine:2600; Drains:80] Intake/Output this shift: No intake/output data recorded.  Physical Exam: Patient is awake, A/O X 4, conversant, and in good spirits. Eyes open spontaneously. They are in NAD and VSS. Doing well. Speech is fluent and appropriate. MAEW with good strength that is symmetric bilaterally.  BUE 5/5 throughout, BLE 5/5 throughout. Sensation to light touch is intact. PERLA, EOMI. CNs grossly intact. Dressing is dry and intact with a small amount of old drainage. Incision is well approximated with no drainage, erythema, or edema. JP drain with approximately 80 ml of serosanguineous drainage. Soft cervical collar in place    Lab Results: No results for input(s): "WBC", "HGB", "HCT", "PLT" in the last 72 hours. BMET No results for input(s): "NA", "K", "CL", "CO2", "GLUCOSE", "BUN", "CREATININE", "CALCIUM" in the last 72 hours.  Studies/Results: DG Cervical Spine 1 View  Result Date: 04/22/2022 CLINICAL DATA:  Fluoroscopic assistance for cervical spinal fusion EXAM: DG CERVICAL SPINE - 1 VIEW COMPARISON:  MR done on 07/15/2021 FINDINGS: Fluoroscopic image shows posterior surgical fusion from C2-C5 levels. Fluoroscopy time 4.8 seconds. Radiation dose 0.89 mGy. IMPRESSION: Fluoroscopic assistance was provided for posterior fusion from C2-C5 levels. Electronically Signed   By: Ernie Avena M.D.   On: 04/22/2022 17:33   DG C-Arm 1-60 Min-No Report  Result Date: 04/22/2022 Fluoroscopy was utilized by the requesting  physician.  No radiographic interpretation.   DG O-ARM IMAGE ONLY/NO REPORT  Result Date: 04/22/2022 There is no Radiologist interpretation  for this exam.   Assessment/Plan: Patient is post-op day 3 s/p C2-5 PCDF. He is recovering well and currently reports upper back and posterior cervical soreness. His neuro exam is stable. He has worked with PT/OT who are both recommending SNF for continued convalescence . Continue soft cervical collar. Continue working on pain control, mobility and ambulating patient. Awaiting SNF placement.    LOS: 2 days     Council Mechanic, DNP, AGNP-C Neurosurgery Nurse Practitioner  [facility] [address], [place], [place] [address] P: [phone]    F: [phone]  04/24/2022 8:04 AM

## 2022-04-24 NOTE — Progress Notes (Signed)
Pt seen and examined  NAD, sitting in chair Baseline dysarthria Full strength in Les Soft collar in place   S/p C2-5 PCDF - awaiting SNF - likely dc drain tomorrow - can start lovenox for dvt ppx tomorrow

## 2022-04-24 NOTE — Progress Notes (Signed)
Physical Therapy Treatment Patient Details Name: Mario [name] MRN: [id number] DOB: April 14, 1951 Today's Date: 04/24/2022   History of Present Illness The pt is a 71 yo male presenting 9/6 with cervical stenosis with cord impingement, now s/p posterior arthrodesis of C2-3, C3-4, and C4-5 with laminectomy and segmental instrumentation.    PT Comments    The pt was agreeable to session and eager to mobilize despite reports of increased pain at surgical site today. Was able to improve ambulation distance but continues to demo unsteady and unsafe gait pattern (likely baseline) that makes him at significant risk for continued falls. The pt frequently turns outside of support of RW and needs max cues for RW use. ModA at times to steady with gait, especially with changes in direction. Will continue to benefit from max skilled PT to address functional strength, power, stability and endurance to improve safety and independence with mobility after d/c.    Recommendations for follow up therapy are one component of a multi-disciplinary discharge planning process, led by the attending physician.  Recommendations may be updated based on patient status, additional functional criteria and insurance authorization.  Follow Up Recommendations  Skilled nursing-short term rehab (<3 hours/day) Can patient physically be transported by private vehicle: Yes   Assistance Recommended at Discharge Frequent or constant Supervision/Assistance  Patient can return home with the following A little help with walking and/or transfers;A little help with bathing/dressing/bathroom;Assistance with cooking/housework;Assistance with feeding;Direct supervision/assist for medications management;Direct supervision/assist for financial management;Assist for transportation;Help with stairs or ramp for entrance   Equipment Recommendations  Rolling walker (2 wheels);BSC/3in1    Recommendations for Other Services       Precautions /  Restrictions Precautions Precautions: Fall;Cervical Precaution Comments: discussed at length with pt, no carry over or ability to recall, pt reports he is able to complete all movements I instructed him to avoid Required Braces or Orthoses: Cervical Brace Cervical Brace: Soft collar Restrictions Weight Bearing Restrictions: No     Mobility  Bed Mobility Overal bed mobility: Needs Assistance Bed Mobility: Supine to Sit     Supine to sit: Supervision     General bed mobility comments: No using log roll despite cues    Transfers Overall transfer level: Needs assistance Equipment used: Rolling walker (2 wheels) Transfers: Sit to/from Stand Sit to Stand: Min guard           General transfer comment: minG for safety    Ambulation/Gait Ambulation/Gait assistance: Min assist, Mod assist Gait Distance (Feet): 150 Feet Assistive device: Rolling walker (2 wheels) Gait Pattern/deviations: Step-through pattern, Decreased stride length, Knee flexed in stance - right, Knee flexed in stance - left, Trunk flexed Gait velocity: decreased but also impulsive for safety Gait velocity interpretation: <1.31 ft/sec, indicative of household ambulator   General Gait Details: pt walks with RW far ahead of him, knees flexed and collapsing medially. quick and impulsive with mobility. poor awareness of lines or safety. turnign far outside [employer] spport despite max cues. able to correct positioning briefly but not maintain       Balance Overall balance assessment: Needs assistance, History of Falls Sitting-balance support: No upper extremity supported Sitting balance-[name]: Good     Standing balance support: Bilateral upper extremity supported, During functional activity, Reliant on assistive device for balance Standing balance-[name]: Poor Standing balance comment: BUE support with up to modA to walk in room  Cognition Arousal/Alertness:  Awake/alert Behavior During Therapy: Impulsive Overall Cognitive Status: Impaired/Different from baseline Area of Impairment: Memory, Following commands, Safety/judgement, Awareness, Problem solving                     Memory: Decreased recall of precautions, Decreased short-term memory Following Commands: Follows one step commands inconsistently (potentially due to impulsivity) Safety/Judgement: Decreased awareness of safety, Decreased awareness of deficits Awareness: Intellectual Problem Solving: Slow processing, Requires verbal cues General Comments: poor awareness of deficits remain, pt in more pain today and asking repeatedly about healing time and recovery. needing repeated cues for safety        Exercises General Exercises - Lower Extremity Long Arc Quad: AROM, Strengthening, Both, 5 reps Hip Flexion/Marching: AROM, Both, 10 reps, Seated    General Comments        Pertinent Vitals/Pain Pain Assessment Pain Assessment: 0-10 Pain Score: 9  Pain Location: neck Pain Descriptors / Indicators: Discomfort, Operative site guarding Pain Intervention(s): Limited activity within patient's tolerance, Monitored during session, Premedicated before session, Repositioned     PT Goals (current goals can now be found in the care plan section) Acute Rehab PT Goals Patient Stated Goal: to return to full independence PT Goal Formulation: With patient Time For Goal Achievement: 05/07/22 Potential to Achieve Goals: Good Progress towards PT goals: Progressing toward goals    Frequency    Min 5X/week      PT Plan Current plan remains appropriate       AM-PAC PT "6 Clicks" Mobility   Outcome Measure  Help needed turning from your back to your side while in a flat bed without using bedrails?: A Little Help needed moving from lying on your back to sitting on the side of a flat bed without using bedrails?: A Little Help needed moving to and from a bed to a chair (including  a wheelchair)?: A Little Help needed standing up from a chair using your arms (e.g., wheelchair or bedside chair)?: A Lot Help needed to walk in hospital room?: A Lot Help needed climbing 3-5 steps with a railing? : Total 6 Click Score: 14    End of Session Equipment Utilized During Treatment: Gait belt;Cervical collar Activity Tolerance: Patient tolerated treatment well Patient left: in chair;with call bell/phone within reach;with chair alarm set Nurse Communication: Mobility status PT Visit Diagnosis: Other abnormalities of gait and mobility (R26.89);Repeated falls (R29.6);Muscle weakness (generalized) (M62.81);History of falling (Z91.81)     Time: 1202-1220 PT Time Calculation (min) (ACUTE ONLY): 18 min  Charges:  $Gait Training: 8-22 mins                     Vickki Muff, PT, DPT   Acute Rehabilitation Department   Ronnie Derby 04/24/2022, 3:08 PM

## 2022-04-24 NOTE — Care Management Important Message (Signed)
Important Message  Patient Details  Name: Mario [name] MRN: [id number] Date of Birth: 10/02/1950   Medicare Important Message Given:  Yes     Sherilyn Banker 04/24/2022, 12:53 PM

## 2022-04-25 MED ORDER — ENOXAPARIN SODIUM 40 MG/0.4ML IJ SOSY
40.0000 mg | PREFILLED_SYRINGE | INTRAMUSCULAR | Status: DC
  Administered 2022-04-25 – 2022-04-28 (×4): 40 mg via SUBCUTANEOUS
  Filled 2022-04-25 (×4): qty 0.4

## 2022-04-25 NOTE — Progress Notes (Signed)
   Providing Compassionate, Quality Care - Together  NEUROSURGERY PROGRESS NOTE   S: No issues overnight.   O: EXAM:  BP 131/78 (BP Location: Right Arm)   Pulse 71   Temp 98.8 F (37.1 C) (Oral)   Resp 18   Ht 5\' 8"  (1.727 m)   Wt 74.8 kg   SpO2 94%   BMI 25.09 kg/m   Awake, alert, oriented x3 PERRL CNs grossly intact  Bilateral lower extremities 5/5, bilateral upper extremities 4-4+/5, slight difficulty with deltoids Dressing clean dry and intact  ASSESSMENT:  71 y.o. 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  Please do not hesitate to call with questions or concerns.   60, DO Neurosurgeon [facility] Cell: [phone]

## 2022-04-25 NOTE — Progress Notes (Signed)
Physical Therapy Treatment Patient Details Name: Mario [name] MRN: [id number] DOB: 05/07/1951 Today's Date: 04/25/2022   History of Present Illness The pt is a 71 yo male presenting 9/6 with cervical stenosis with cord impingement, now s/p posterior arthrodesis of C2-3, C3-4, and C4-5 with laminectomy and segmental instrumentation.    PT Comments    Pt required min assist bed mobility, min guard transfers, and min assist gait 150' with RW. Pt presents with unsafe gait: quick pace, flexed posture, and pushing RW too far in front of him. Reviewed cervical precautions but no recall or carryover during session. Pt's desire is to return home but his home environment is very unsafe, especially in his vulnerable healing state. Strongly recommend ST SNF at d/c.    Recommendations for follow up therapy are one component of a multi-disciplinary discharge planning process, led by the attending physician.  Recommendations may be updated based on patient status, additional functional criteria and insurance authorization.  Follow Up Recommendations  Skilled nursing-short term rehab (<3 hours/day) Can patient physically be transported by private vehicle: Yes   Assistance Recommended at Discharge Frequent or constant Supervision/Assistance  Patient can return home with the following A little help with walking and/or transfers;A little help with bathing/dressing/bathroom;Assistance with cooking/housework;Assistance with feeding;Direct supervision/assist for medications management;Direct supervision/assist for financial management;Assist for transportation;Help with stairs or ramp for entrance   Equipment Recommendations  Rolling walker (2 wheels);BSC/3in1    Recommendations for Other Services       Precautions / Restrictions Precautions Precautions: Fall;Cervical Precaution Comments: reviewed cervical precautions. No carry over or recall during session. Required Braces or Orthoses: Cervical  Brace Cervical Brace: Soft collar     Mobility  Bed Mobility Overal bed mobility: Needs Assistance Bed Mobility: Supine to Sit, Sit to Supine     Supine to sit: Min assist Sit to supine: Min guard   General bed mobility comments: increased time, cues for sequencing, assist to elevate trunk    Transfers Overall transfer level: Needs assistance Equipment used: Rolling walker (2 wheels) Transfers: Sit to/from Stand Sit to Stand: Min guard           General transfer comment: minG for safety    Ambulation/Gait Ambulation/Gait assistance: Min assist Gait Distance (Feet): 150 Feet Assistive device: Rolling walker (2 wheels) Gait Pattern/deviations: Step-through pattern, Decreased stride length, Knee flexed in stance - right, Knee flexed in stance - left, Trunk flexed Gait velocity: too fast, unsafe pace     General Gait Details: overall flexed position pushing RW too far in front of him. Pt will correct with cueing but only maintain corrected position for a few seconds.   Stairs             Wheelchair Mobility    Modified Rankin (Stroke Patients Only)       Balance Overall balance assessment: Needs assistance, History of Falls Sitting-balance support: No upper extremity supported, Feet supported Sitting balance-[name]: Good     Standing balance support: Bilateral upper extremity supported, During functional activity, Reliant on assistive device for balance Standing balance-[name]: Poor                              Cognition Arousal/Alertness: Awake/alert Behavior During Therapy: Impulsive Overall Cognitive Status: No family/caregiver present to determine baseline cognitive functioning Area of Impairment: Memory, Following commands, Safety/judgement, Awareness, Problem solving, Attention  Current Attention Level: Selective Memory: Decreased recall of precautions, Decreased short-term memory Following  Commands: Follows one step commands inconsistently Safety/Judgement: Decreased awareness of safety, Decreased awareness of deficits Awareness: Intellectual Problem Solving: Slow processing, Requires verbal cues          Exercises      General Comments General comments (skin integrity, edema, etc.): VSS on RA      Pertinent Vitals/Pain Pain Assessment Pain Assessment: 0-10 Pain Score: 10-Worst pain ever Pain Location: neck Pain Descriptors / Indicators: Discomfort, Operative site guarding, Grimacing Pain Intervention(s): Monitored during session, Repositioned, Premedicated before session    Home Living                          Prior Function            PT Goals (current goals can now be found in the care plan section) Acute Rehab PT Goals Patient Stated Goal: to return to full independence Progress towards PT goals: Progressing toward goals    Frequency    Min 5X/week      PT Plan Current plan remains appropriate    Co-evaluation              AM-PAC PT "6 Clicks" Mobility   Outcome Measure  Help needed turning from your back to your side while in a flat bed without using bedrails?: A Little Help needed moving from lying on your back to sitting on the side of a flat bed without using bedrails?: A Little Help needed moving to and from a bed to a chair (including a wheelchair)?: A Little Help needed standing up from a chair using your arms (e.g., wheelchair or bedside chair)?: A Little Help needed to walk in hospital room?: A Little Help needed climbing 3-5 steps with a railing? : Total 6 Click Score: 16    End of Session Equipment Utilized During Treatment: Gait belt;Cervical collar Activity Tolerance: Patient tolerated treatment well Patient left: in bed;with call bell/phone within reach Nurse Communication: Mobility status PT Visit Diagnosis: Other abnormalities of gait and mobility (R26.89);Repeated falls (R29.6);Muscle weakness  (generalized) (M62.81);History of falling (Z91.81)     Time: 1610-9604 PT Time Calculation (min) (ACUTE ONLY): 15 min  Charges:  $Gait Training: 8-22 mins                     Aida Raider, PT  Office # [phone] Pager [phone]    Ilda Foil 04/25/2022, 2:40 PM

## 2022-04-25 NOTE — TOC Initial Note (Signed)
Transition of Care [facility]) - Initial/Assessment Note    Patient Details  Name: Mario [name] MRN: [id number] Date of Birth: October 15, 1950  Transition of [facility]) CM/SW Contact:    Carley Hammed, LCSWA Phone Number: 04/25/2022, 4:28 PM  Clinical Narrative:                 Pt is now agreeable to SNF, with a preference for [facility] rehab as it is close to his house. CSW to complete workup and will fax to facilities in the surrounding area. TOC will continue to follow for DC needs.  Expected Discharge Plan: Skilled Nursing Facility Barriers to Discharge: SNF Pending bed offer, Continued Medical Work up   Patient Goals and CMS Choice Patient states their goals for this hospitalization and ongoing recovery are:: Pt agreeable to SNF. CMS Medicare.gov Compare Post Acute Care list provided to:: Patient Choice offered to / list presented to : Patient  Expected Discharge Plan and Services Expected Discharge Plan: Skilled Nursing Facility     Post Acute Care Choice: Skilled Nursing Facility Living arrangements for the past 2 months: Single Family Home                                      Prior Living Arrangements/Services Living arrangements for the past 2 months: Single Family Home Lives with:: Self Patient language and need for interpreter reviewed:: Yes Do you feel safe going back to the place where you live?: Yes      Need for Family Participation in Patient Care: Yes (Comment) Care giver support system in place?: No (comment)   Criminal Activity/Legal Involvement Pertinent to Current Situation/Hospitalization: No - Comment as needed  Activities of Daily Living Home Assistive Devices/Equipment: Walker (specify type) ADL Screening (condition at time of admission) Patient's cognitive ability adequate to safely complete daily activities?: Yes Is the patient deaf or have difficulty hearing?: No Does the patient have difficulty seeing, even when wearing  glasses/contacts?: No Does the patient have difficulty concentrating, remembering, or making decisions?: Yes Patient able to express need for assistance with ADLs?: Yes Does the patient have difficulty dressing or bathing?: No Independently performs ADLs?: Yes (appropriate for developmental age) Does the patient have difficulty walking or climbing stairs?: Yes Weakness of Legs: Both Weakness of Arms/Hands: Both  Permission Sought/Granted Permission sought to share information with : Family Supports Permission granted to share information with : Yes, Verbal Permission Granted  Share Information with NAME: Marylene Land     Permission granted to share info w Relationship: [employer] Medic     Emotional Assessment Appearance:: Appears stated age Attitude/Demeanor/Rapport: Engaged Affect (typically observed): Appropriate Orientation: : Oriented to Self, Oriented to Place, Oriented to  Time, Oriented to Situation Alcohol / Substance Use: Not Applicable Psych Involvement: No (comment)  Admission diagnosis:  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 [place] [address] Phone: [phone] Fax: [phone]     Social Determinants of Health (SDOH) Interventions    Readmission Risk Interventions     No data  to display

## 2022-04-25 NOTE — TOC Progression Note (Signed)
Transition of Care [facility]) - Progression Note    Patient Details  Name: Ronelle Smallman MRN: [id number] Date of Birth: [birth year]  Transition of Care [facility]) CM/SW Contact  Bess Kinds, RN Phone Number: [phone] 04/25/2022, 3:42 PM  Clinical Narrative:     Spoke with patient at the bedside to discuss post acute transition to home. Discussed recommendations for patient to receive STR at a facility before transitioning home where he lives alone. Patient now saying that he is agreeable to STR. If possible he would like to be in [place] area. CSW make aware.        Expected Discharge Plan and Services                                                 Social Determinants of Health (SDOH) Interventions    Readmission Risk Interventions     No data to display

## 2022-04-25 NOTE — Social Work (Signed)
CSW attempting to complete SNF assessment for pt and was notified by RN that pt would likely refuse. Pt reported to RN that he has an old house that only has an outhouse and he has to boil his water. Per chart review pt is alert and oriented x4. CSW asked RN if there are any concerns with pt being competent to make decisions, RN directed CSW to speak with pt's community paramedic.   CSW spoke with pt and notified him of the recommendation for SNF at DC. Pt asks  "Do I have to?" CSW advised that it is ultimately pt's choice, but he is needing a lot of help, and we want to make sure he is safe at discharge. Pt states he can do just as well at home as he can at [facility]. CSW asked if TOC could arrange for [facility] to come out to the house to check on him and provide services, pt agreeable. Pt states he has a walker at home. Pt notes Marylene Land, the community Paramedic would come get him at DC and bring his wallet. Pt oriented and answering questions appropriately during assessment.   CSW left VM for Marylene Land to follow up on concerns for safety. CSW notified CM of need for [facility] and requested his services be maximized and requested a Child psychotherapist as well, if possible. CM to arrange. TOC available for further needs.

## 2022-04-25 NOTE — [place] FL2 (Signed)
[place] MEDICAID FL2 LEVEL OF CARE SCREENING TOOL     IDENTIFICATION  Patient Name: Mario [name] Birthdate: 12/16/1950 Sex: male Admission Date (Current Location): 04/22/2022  [facility] and [place] Number:  Producer, television/film/video and Address:  [facility], [address] [place], [place] [address]      Provider Number: [id number]  Attending Physician Name and Address:  Bedelia Person, MD  Relative Name and Phone Number:       Current Level of Care: Hospital Recommended Level of Care: Skilled Nursing Facility Prior Approval Number:    Date Approved/Denied:   PASRR Number: [id number]  Discharge Plan: SNF    Current Diagnoses: Patient Active Problem List   Diagnosis Date Noted   Stenosis of cervical spine with myelopathy (HCC) 04/22/2022   Spinal stenosis in cervical region 04/22/2022   Anemia 07/01/2021   Encounter for screening colonoscopy 07/01/2021    Orientation RESPIRATION BLADDER Height & Weight     Self, Time, Situation, Place  Normal Continent Weight: 165 lb (74.8 kg) Height:  5\' 8"  (172.7 cm)  BEHAVIORAL SYMPTOMS/MOOD NEUROLOGICAL BOWEL NUTRITION STATUS      Continent Diet (See DC summary)  AMBULATORY STATUS COMMUNICATION OF NEEDS Skin   Limited Assist Verbally Surgical wounds (Neck incision)                       Personal Care Assistance Level of Assistance  Bathing, Feeding, Dressing Bathing Assistance: Limited assistance Feeding assistance: Independent Dressing Assistance: Limited assistance     Functional Limitations Info  Sight, Hearing, Speech Sight Info: Adequate Hearing Info: Adequate Speech Info: Adequate    SPECIAL CARE FACTORS FREQUENCY  PT (By licensed PT), OT (By licensed OT)     PT Frequency: 5x week OT Frequency: 5x week            Contractures Contractures Info: Not present    Additional Factors Info  Code Status, Allergies Code Status Info: Full Allergies Info: NKA            Current Medications (04/25/2022):  This is the current hospital active medication list Current Facility-Administered Medications  Medication Dose Route Frequency Provider Last Rate Last Admin   0.9 %  sodium chloride infusion  250 mL Intravenous Continuous 06/25/2022, MD   Stopped at 04/24/22 1358   acetaminophen (TYLENOL) tablet 650 mg  650 mg Oral Q4H PRN 06/24/22, MD   650 mg at 04/25/22 1443   Or   acetaminophen (TYLENOL) suppository 650 mg  650 mg Rectal Q4H PRN 06/25/22, MD       amiodarone (PACERONE) tablet 100 mg  100 mg Oral Daily Bedelia Person, MD   100 mg at 04/25/22 1007   amLODipine (NORVASC) tablet 10 mg  10 mg Oral Daily 06/25/22, MD   10 mg at 04/25/22 1007   calcitRIOL (ROCALTROL) capsule 0.25 mcg  0.25 mcg Oral Q M,W,F 06/25/22, MD   0.25 mcg at 04/24/22 1031   cyclobenzaprine (FLEXERIL) tablet 10 mg  10 mg Oral TID PRN 06/24/22, MD   10 mg at 04/23/22 2201   docusate sodium (COLACE) capsule 100 mg  100 mg Oral BID 2202, MD   100 mg at 04/25/22 1007   enoxaparin (LOVENOX) injection 40 mg  40 mg Subcutaneous Q24H Dawley, Troy C, DO   40 mg at 04/25/22 1008   finasteride (PROSCAR) tablet 5 mg  5 mg Oral Daily Bedelia Person, MD   5 mg at 04/25/22 1006   furosemide (LASIX) tablet 20 mg  20 mg Oral Daily Bedelia Person, MD   20 mg at 04/25/22 1006   HYDROmorphone (DILAUDID) injection 0.5 mg  0.5 mg Intravenous Q2H PRN Bedelia Person, MD       irbesartan (AVAPRO) tablet 37.5 mg  37.5 mg Oral Daily Bedelia Person, MD   37.5 mg at 04/25/22 1009   menthol-cetylpyridinium (CEPACOL) lozenge 3 mg  1 lozenge Oral PRN Bedelia Person, MD       Or   phenol (CHLORASEPTIC) mouth spray 1 spray  1 spray Mouth/Throat PRN Bedelia Person, MD       metoprolol succinate (TOPROL-XL) 24 hr tablet 25 mg  25 mg Oral Daily Bedelia Person, MD   25 mg at 04/25/22 1008   ondansetron (ZOFRAN) tablet 4 mg   4 mg Oral Q6H PRN Bedelia Person, MD       Or   ondansetron [facility]) injection 4 mg  4 mg Intravenous Q6H PRN Bedelia Person, MD       oxyCODONE (Oxy IR/ROXICODONE) immediate release tablet 10 mg  10 mg Oral Q3H PRN Bedelia Person, MD   10 mg at 04/23/22 2200   oxyCODONE (Oxy IR/ROXICODONE) immediate release tablet 5 mg  5 mg Oral Q3H PRN Bedelia Person, MD   5 mg at 04/25/22 1006   pantoprazole (PROTONIX) EC tablet 40 mg  40 mg Oral Daily Bedelia Person, MD   40 mg at 04/25/22 1009   polyethylene glycol (MIRALAX / GLYCOLAX) packet 17 g  17 g Oral Daily PRN Bedelia Person, MD       sodium chloride flush (NS) 0.9 % injection 3 mL  3 mL Intravenous Q12H Bedelia Person, MD   3 mL at 04/25/22 1010   sodium chloride flush (NS) 0.9 % injection 3 mL  3 mL Intravenous PRN Bedelia Person, MD       sodium phosphate (FLEET) 7-19 GM/118ML enema 1 enema  1 enema Rectal Once PRN Bedelia Person, MD       tamsulosin [facility]) capsule 0.4 mg  0.4 mg Oral Daily Bedelia Person, MD   0.4 mg at 04/25/22 1007     Discharge Medications: Please see discharge summary for a list of discharge medications.  Relevant Imaging Results:  Relevant Lab Results:   Additional Information SS#[id number] [place]

## 2022-04-25 NOTE — Plan of Care (Signed)

## 2022-04-26 MED ORDER — PNEUMOCOCCAL 20-VAL CONJ VACC 0.5 ML IM SUSY
0.5000 mL | PREFILLED_SYRINGE | INTRAMUSCULAR | Status: AC
  Administered 2022-04-28: 0.5 mL via INTRAMUSCULAR
  Filled 2022-04-26: qty 0.5

## 2022-04-26 NOTE — Progress Notes (Signed)
  NEUROSURGERY PROGRESS NOTE   No issues overnight. Neck soreness improving.  EXAM:  BP 123/77 (BP Location: Right Arm)   Pulse 62   Temp 98.3 F (36.8 C) (Axillary)   Resp 20   Ht 5\' 8"  (1.727 m)   Wt 74.8 kg   SpO2 92%   BMI 25.09 kg/m   Awake, alert, oriented  Speech fluent, appropriate  CN grossly intact  5/5 BUE 5/5 BLE Wound c/d/i  IMPRESSION:  71 y.o. male POD# 4 pos cervical decompression/fusion, progressing as expected.  PLAN: - Cont to mobilize with therapy - Plan on SNF at d/c   60, MD [facility]

## 2022-04-26 NOTE — Plan of Care (Signed)

## 2022-04-27 NOTE — TOC Progression Note (Addendum)
Transition of Care [facility]) - Progression Note    Patient Details  Name: Mario [name] MRN: [id number] Date of Birth: 27-Aug-1950  Transition of Care [facility]) CM/SW Contact  Eduard Roux, [place] Phone Number: 04/27/2022, 2:09 PM  Clinical Narrative:     [place] confirmed bed offer, they can admit patient tomorrow if medically stable.   RN updated  TOC will continue to follow and assist with discharge planning.  Antony Blackbird, MSW, LCSW Clinical Social Worker    Expected Discharge Plan: Skilled Nursing Facility Barriers to Discharge:  (SNF bed confirmation)  Expected Discharge Plan and Services Expected Discharge Plan: Skilled Nursing Facility     Post Acute Care Choice: Skilled Nursing Facility Living arrangements for the past 2 months: Single Family Home                                       Social Determinants of Health (SDOH) Interventions    Readmission Risk Interventions     No data to display

## 2022-04-27 NOTE — Progress Notes (Signed)
Occupational Therapy Treatment Patient Details Name: Mario [name] MRN: [id number] DOB: [birth year] Today's Date: 04/27/2022   History of present illness The pt is a 71 yo male presenting 9/6 with cervical stenosis with cord impingement, now s/p posterior arthrodesis of C2-3, C3-4, and C4-5 with laminectomy and segmental instrumentation.   OT comments  Pt currently at min assist level overall for functional transfers and selfcare sit to stand.  Still with decreased knowledge and follow through with use of DME and adhering to his cervical precautions.  Feel he will benefit from continued acute care OT to continue progression with ADL function and with hopeful transition to SNF for post acute rehab.     Recommendations for follow up therapy are one component of a multi-disciplinary discharge planning process, led by the attending physician.  Recommendations may be updated based on patient status, additional functional criteria and insurance authorization.    Follow Up Recommendations  Skilled nursing-short term rehab (<3 hours/day)    Assistance Recommended at Discharge Frequent or constant Supervision/Assistance  Patient can return home with the following  A little help with walking and/or transfers;A little help with bathing/dressing/bathroom;Assistance with cooking/housework;Direct supervision/assist for medications management;Direct supervision/assist for financial management;Assist for transportation   Equipment Recommendations  Other (comment) (TBD next venue of care)       Precautions / Restrictions Precautions Precautions: Fall;Cervical Precaution Comments: discussed cervical precautions with pt, he was able to state 1/3 Required Braces or Orthoses: Cervical Brace Cervical Brace: Soft collar (can be removed for showering and in bed, but has been wearing all of the time secondary to not being able to maintain precautions.) Restrictions Weight Bearing Restrictions: No        Mobility Bed Mobility   Bed Mobility: Supine to Sit     Supine to sit: Supervision     General bed mobility comments: Pt sat up abruptly without assistance.  When therapist tried to educate him on rolling to the side and then sitting up, his friend stated "He can't because of his right arm, so he always gets up that way."    Transfers Overall transfer level: Needs assistance Equipment used: Rolling walker (2 wheels) Transfers: Sit to/from Stand Sit to Stand: Min guard           General transfer comment: Min assist to min guard for functional mobility out of the room and down to the ICU door and back.  Mod demonstrational cueing for staying inside of the walker and not pushing it too far away.     Balance Overall balance assessment: Needs assistance, History of Falls Sitting-balance support: Bilateral upper extremity supported Sitting balance-[name]: Good Sitting balance - Comments: Able to maintain balance EOB without support but leans back after a few mins secondary to increased neck pain.   Standing balance support: Bilateral upper extremity supported, During functional activity, Reliant on assistive device for balance Standing balance-[name]: Poor Standing balance comment: BUE support on the RW for mobility in and out of the room.                           ADL either performed or assessed with clinical judgement   ADL Overall ADL's : Needs assistance/impaired                         Toilet Transfer: Minimal assistance;Rolling walker (2 wheels);Regular Toilet;Grab bars;Ambulation           Functional  mobility during ADLs: Minimal assistance;Rolling walker (2 wheels);Cueing for safety;Cueing for sequencing General ADL Comments: Pt only able to state 1 cervical precaution when asked.  His paramedic friend was in the room and stated that they wanted him to wear the cervical collar more instead of just OOB, since he is having trouble  following his precautions.  He continues to flex his head at rest as well as attempting to reach down to his feet.  Educated him on trying to avoid bending to help decrease stress on the neck and demonstrated use of a sockaide as well as presenting and discussing benefits of a reacher.  He has agreed to [facility] for follow-up therapy.               Cognition Arousal/Alertness: Awake/alert Behavior During Therapy: Impulsive Overall Cognitive Status: History of cognitive impairments - at baseline Area of Impairment: Attention, Safety/judgement, Awareness, Problem solving                   Current Attention Level: Sustained Memory: Decreased recall of precautions, Decreased short-term memory Following Commands: Follows one step commands inconsistently, Follows multi-step commands with increased time Safety/Judgement: Decreased awareness of safety, Decreased awareness of deficits   Problem Solving: Slow processing, Requires verbal cues General Comments: Pt with poor awareness of walker positioning.  Needs mod to max demonstrational cueing to not push the walker too far in front of him as well as staying in closer to the RW during mobility.                   Pertinent Vitals/ Pain       Pain Assessment Pain Assessment: Faces Faces Pain Scale: Hurts little more Pain Location: neck Pain Descriptors / Indicators: Discomfort Pain Intervention(s): Limited activity within patient's tolerance, Repositioned         Frequency  Min 2X/week        Progress Toward Goals  OT Goals(current goals can now be found in the care plan section)  Progress towards OT goals: Progressing toward goals  Acute Rehab OT Goals Patient Stated Goal: Pt did not state but agreeable to participation in therapy OT Goal Formulation: With patient Time For Goal Achievement: 05/07/22 Potential to Achieve Goals: Good ADL Goals Pt Will Perform Upper Body Dressing: with supervision;sitting Pt Will  Perform Lower Body Dressing: with min guard assist;sit to/from stand;with adaptive equipment Pt Will Transfer to Toilet: with min guard assist;bedside commode;ambulating Pt Will Perform Toileting - Clothing Manipulation and hygiene: with min guard assist;sit to/from stand;sitting/lateral leans Additional ADL Goal #1: Pt will independently recall cervical precautions in preparation for ADLs Additional ADL Goal #2: Pt will perform bed mobility with Supervision using log roll technique in preparation for ADLs  Plan Discharge plan needs to be updated       AM-PAC OT "6 Clicks" Daily Activity     Outcome Measure   Help from another person eating meals?: None Help from another person taking care of personal grooming?: A Little Help from another person toileting, which includes using toliet, bedpan, or urinal?: A Little Help from another person bathing (including washing, rinsing, drying)?: A Little Help from another person to put on and taking off regular upper body clothing?: A Little Help from another person to put on and taking off regular lower body clothing?: A Little 6 Click Score: 19    End of Session Equipment Utilized During Treatment: Rolling walker (2 wheels);Gait belt;Cervical collar  OT Visit Diagnosis: Unsteadiness on feet (R26.81);Other  abnormalities of gait and mobility (R26.89);Muscle weakness (generalized) (M62.81);Pain;History of falling (Z91.81);Other symptoms and signs involving cognitive function Pain - part of body:  (neck)   Activity Tolerance Patient tolerated treatment well;Other (comment)   Patient Left in bed;with call bell/phone within reach;with bed alarm set   Nurse Communication Mobility status        Time: 4270-6237 OT Time Calculation (min): 38 min  Charges: OT General Charges $OT Visit: 1 Visit OT Treatments $Self Care/Home Management : 38-52 mins  Parth Mccormac OTR/L 04/27/2022, 12:37 PM

## 2022-04-27 NOTE — TOC Progression Note (Signed)
Transition of Care [facility]) - Progression Note    Patient Details  Name: Mario [name] MRN: [id number] Date of Birth: May 03, 1951  Transition of Care [facility]) CM/SW [place], [place] Phone Number: 04/27/2022, 11:42 AM  Clinical Narrative:     CSW met with patient at bedside. CSW provided bed offers- patient and friend, Levada Dy both agreeable to short term rehab at [facility].  CSW sent message to [facility] to confirm bed offer- CSW awaiting response  TOC will continue to follow and assist with discharge planning.   Thurmond Butts, MSW, LCSW Clinical Social Worker    Expected Discharge Plan: Skilled Nursing Facility Barriers to Discharge:  (SNF bed confirmation)  Expected Discharge Plan and Services Expected Discharge Plan: [place] Choice: [place] Living arrangements for the past 2 months: Single Family Home                                       Social Determinants of Health (SDOH) Interventions    Readmission Risk Interventions     No data to display

## 2022-04-27 NOTE — Progress Notes (Signed)
Physical Therapy Treatment Patient Details Name: Mario [name] MRN: [id number] DOB: 1950/10/31 Today's Date: 04/27/2022   History of Present Illness The pt is a 71 yo male presenting 9/6 with cervical stenosis with cord impingement, now s/p posterior arthrodesis of C2-3, C3-4, and C4-5 with laminectomy and segmental instrumentation.    PT Comments    The pt was agreeable to session, good progress with activity endurance this afternoon. However, pt remains limited by poor safety awareness and significant dynamic instability which keep him at increased risk of falls with OOB mobility. He requires minA to complete transfers and minA with mod cues for safety with mobility and ambulation. Will continue to benefit from skilled PT acutely and prior to return home where he lives alone to reduce risk of falls after surgery.     Recommendations for follow up therapy are one component of a multi-disciplinary discharge planning process, led by the attending physician.  Recommendations may be updated based on patient status, additional functional criteria and insurance authorization.  Follow Up Recommendations  Skilled nursing-short term rehab (<3 hours/day) Can patient physically be transported by private vehicle: Yes   Assistance Recommended at Discharge Frequent or constant Supervision/Assistance  Patient can return home with the following A little help with walking and/or transfers;A little help with bathing/dressing/bathroom;Assistance with cooking/housework;Assistance with feeding;Direct supervision/assist for medications management;Direct supervision/assist for financial management;Assist for transportation;Help with stairs or ramp for entrance   Equipment Recommendations  Rolling walker (2 wheels);BSC/3in1    Recommendations for Other Services       Precautions / Restrictions Precautions Precautions: Fall;Cervical Precaution Booklet Issued: Yes (comment) Precaution Comments: discussed  cervical precautions with pt, he was able to state 1/3 Required Braces or Orthoses: Cervical Brace Cervical Brace: Soft collar (can be removed for showering and in bed, but has been wearing all of the time secondary to not being able to maintain precautions.) Restrictions Weight Bearing Restrictions: No     Mobility  Bed Mobility Overal bed mobility: Needs Assistance Bed Mobility: Supine to Sit     Supine to sit: Supervision     General bed mobility comments: supervision with cues for cervical precautions    Transfers Overall transfer level: Needs assistance Equipment used: Rolling walker (2 wheels) Transfers: Sit to/from Stand Sit to Stand: Min guard, Min assist           General transfer comment: minG to minA to rise and steady, minor posterior LOB. cues for hand placement    Ambulation/Gait Ambulation/Gait assistance: Min assist Gait Distance (Feet): 150 Feet Assistive device: Rolling walker (2 wheels) Gait Pattern/deviations: Step-through pattern, Decreased stride length, Knee flexed in stance - right, Knee flexed in stance - left, Trunk flexed, Shuffle (R foot externally rotated) Gait velocity: too fast, unsafe pace Gait velocity interpretation: <1.31 ft/sec, indicative of household ambulator   General Gait Details: overall flexed position pushing RW too far in front of him. Pt will correct with cueing but only maintain corrected position for a few seconds.      Balance Overall balance assessment: Needs assistance, History of Falls Sitting-balance support: Bilateral upper extremity supported Sitting balance-[name]: Good Sitting balance - Comments: Able to maintain balance EOB without support but leans back after a few mins secondary to increased neck pain.   Standing balance support: Bilateral upper extremity supported, During functional activity, Reliant on assistive device for balance Standing balance-[name]: Poor Standing balance comment: BUE  support on the RW for mobility in and out of the room.  Cognition Arousal/Alertness: Awake/alert Behavior During Therapy: Impulsive Overall Cognitive Status: History of cognitive impairments - at baseline Area of Impairment: Attention, Safety/judgement, Awareness, Problem solving, Memory, Following commands                   Current Attention Level: Sustained Memory: Decreased recall of precautions, Decreased short-term memory Following Commands: Follows one step commands inconsistently, Follows multi-step commands with increased time Safety/Judgement: Decreased awareness of safety, Decreased awareness of deficits Awareness: Intellectual Problem Solving: Slow processing, Requires verbal cues General Comments: Pt with poor awareness of walker positioning.  Needs mod to max demonstrational cueing to not push the walker too far in front of him as well as staying in closer to the RW during mobility. able to follow directional cues and found his way back to his room.        Exercises Other Exercises Other Exercises: standing squat with BUE support on rail x 10    General Comments General comments (skin integrity, edema, etc.): VSS on RA      Pertinent Vitals/Pain Pain Assessment Pain Assessment: Faces Faces Pain Scale: Hurts whole lot Pain Location: neck Pain Descriptors / Indicators: Discomfort Pain Intervention(s): Limited activity within patient's tolerance, Monitored during session, Repositioned (offered heat/ice and pt declined)     PT Goals (current goals can now be found in the care plan section) Acute Rehab PT Goals Patient Stated Goal: to return to full independence PT Goal Formulation: With patient Time For Goal Achievement: 05/07/22 Potential to Achieve Goals: Good Progress towards PT goals: Progressing toward goals    Frequency    Min 5X/week      PT Plan Current plan remains appropriate       AM-PAC PT "6  Clicks" Mobility   Outcome Measure  Help needed turning from your back to your side while in a flat bed without using bedrails?: A Little Help needed moving from lying on your back to sitting on the side of a flat bed without using bedrails?: A Little Help needed moving to and from a bed to a chair (including a wheelchair)?: A Little Help needed standing up from a chair using your arms (e.g., wheelchair or bedside chair)?: A Little Help needed to walk in hospital room?: A Little Help needed climbing 3-5 steps with a railing? : Total 6 Click Score: 16    End of Session Equipment Utilized During Treatment: Gait belt;Cervical collar Activity Tolerance: Patient tolerated treatment well Patient left: in bed;with call bell/phone within reach Nurse Communication: Mobility status PT Visit Diagnosis: Other abnormalities of gait and mobility (R26.89);Repeated falls (R29.6);Muscle weakness (generalized) (M62.81);History of falling (Z91.81)     Time: 2993-7169 PT Time Calculation (min) (ACUTE ONLY): 14 min  Charges:  $Gait Training: 8-22 mins                     Vickki Muff, PT, DPT   Acute Rehabilitation Department   Ronnie Derby 04/27/2022, 5:14 PM

## 2022-04-27 NOTE — Progress Notes (Signed)
Subjective: Patient reports posterior neck and upper back soreness. He has no other complaints. NAE ON.   Objective: Vital signs in last 24 hours: Temp:  [99 F (37.2 C)-100.6 F (38.1 C)] 99.4 F (37.4 C) (09/11 0300) Pulse Rate:  [57-72] 57 (09/11 0300) Resp:  [16-21] 20 (09/11 0300) BP: (106-143)/(66-85) 143/84 (09/11 0300) SpO2:  [91 %-96 %] 96 % (09/11 0300)  Intake/Output from previous day: 09/10 0701 - 09/11 0700 In: 650 [P.O.:650] Out: 1525 [Urine:1525] Intake/Output this shift: No intake/output data recorded.  Physical Exam: Patient is awake, A/O X 4, conversant, and in good spirits. Eyes open spontaneously. They are in NAD and VSS. Doing well. Speech is fluent and appropriate. MAEW with good strength that is symmetric bilaterally.  BUE 5/5 throughout, BLE 5/5 throughout. Sensation to light touch is intact. PERLA, EOMI. CNs grossly intact. Dressing is dry and intact with a small amount of old drainage. Incision is well approximated with no drainage, erythema, or edema. Soft cervical collar in place  Lab Results: No results for input(s): "WBC", "HGB", "HCT", "PLT" in the last 72 hours. BMET No results for input(s): "NA", "K", "CL", "CO2", "GLUCOSE", "BUN", "CREATININE", "CALCIUM" in the last 72 hours.  Studies/Results: No results found.  Assessment/Plan: Patient is post-op day5 s/p C2-5 PCDF. He is continuing to recover well and continues to report upper back and posterior cervical soreness that appears appropriate. His neuro exam is stable. He has worked with PT/OT who are both recommending SNF for continued convalescence . Continue soft cervical collar. Continue working on pain control, mobility and ambulating patient. Awaiting SNF placement.   LOS: 5 days     Council Mechanic, DNP, AGNP-C Neurosurgery Nurse Practitioner  [facility] [address], [place], [place] [address] P: [phone]    F:  [phone]  04/27/2022 8:06 AM

## 2022-04-28 MED ORDER — OXYCODONE-ACETAMINOPHEN 5-325 MG PO TABS: 1.0000 | ORAL_TABLET | ORAL | 0 refills | Status: DC | PRN

## 2022-04-28 MED ORDER — CYCLOBENZAPRINE HCL 10 MG PO TABS: 10.0000 mg | ORAL_TABLET | Freq: Three times a day (TID) | ORAL | 2 refills | Status: DC | PRN

## 2022-04-28 NOTE — TOC Transition Note (Signed)
Transition of Care [facility]) - CM/SW Discharge Note   Patient Details  Name: Mario [name] MRN: [id number] Date of Birth: [birth year]  Transition of Care [facility]) CM/SW Contact:  Eduard Roux, LCSW Phone Number: 04/28/2022, 12:08 PM   Clinical Narrative:     Patient will Discharge to: [facility] Discharge Date: 04/28/2022 Family Notified: Westley Gambles Transport IO:NGEX  Per MD patient is ready for discharge. RN, patient, and facility notified of discharge. Discharge Summary sent to facility. RN given number for [phone], room 509-B. Ambulance transport requested for patient.   Clinical Social Worker signing off.  Antony Blackbird, MSW, LCSW Clinical Social Worker    Final next level of care: Skilled Nursing Facility Barriers to Discharge: Barriers Resolved   Patient Goals and CMS Choice Patient states their goals for this hospitalization and ongoing recovery are:: Pt agreeable to SNF. CMS Medicare.gov Compare Post Acute Care list provided to:: Patient Choice offered to / list presented to : Patient  Discharge Placement              Patient chooses bed at: [facility] Patient to be transferred to facility by: [facility] Name of family member notified: friend, Marylene Land Patient and family notified of of transfer: 04/28/22  Discharge Plan and Services     Post Acute Care Choice: Skilled Nursing Facility                               Social Determinants of Health (SDOH) Interventions     Readmission Risk Interventions     No data to display

## 2022-04-28 NOTE — Discharge Summary (Signed)
Physician Discharge Summary  Patient ID: Mario [name] MRN: [id number] DOB/AGE: Sep 09, 1950 71 y.o.  Admit date: 04/22/2022 Discharge date: 04/28/2022  Admission Diagnoses: Cervical stenosis with myelopathy  Discharge Diagnoses: Cervical stenosis with myelopathy Principal Problem:   Stenosis of cervical spine with myelopathy [facility]) Active Problems:   Spinal stenosis in cervical region   Discharged Condition: good  Hospital Course: The patient was admitted on 04/22/2022 and taken to the operating room where the patient underwent C2-5 PCDF. The patient tolerated the procedure well and was taken to the recovery room and then to the floor in stable condition. The hospital course was routine. There were no complications. The wound remained clean dry and intact. Pt had appropriate back soreness. No complaints of leg pain or new N/T/W. The patient remained afebrile with stable vital signs, and tolerated a regular diet. The patient continued to increase activities, and pain was well controlled with oral pain medications.   Consults: None  Significant Diagnostic Studies: radiology: X-Ray: intraoperative   Treatments: surgery:  1. Posterior arthrodesis C2-3 2.  Posterior arthrodesis, additional levels C3-4, C4-5 3. Laminectomy and foraminotomies, C2-3, C3-4, C4-5 4. Segmental instrumentation with lateral mass/pedicle screw and rod construct, C2-3-4-5 5. Harvest of local autograft 6. Use of morselized bone allograft  7.  Intraoperative neuronavigation with Stealth, intraoperative CT with O-arm    Discharge Exam: Blood pressure 121/77, pulse 68, temperature 99.6 F (37.6 C), temperature source Oral, resp. rate 20, height 5\' 8"  (1.727 m), weight 74.8 kg, SpO2 92 %. Physical Exam: Patient is awake, A/O X 4, conversant, and in good spirits. Eyes open spontaneously. They are in NAD and VSS. Doing well. Speech is fluent and appropriate. MAEW with good strength that is symmetric bilaterally.  BUE  5/5 throughout, BLE 5/5 throughout. Sensation to light touch is intact. PERLA, EOMI. CNs grossly intact. Dressing is dry and intact with a small amount of old drainage. Incision is well approximated with no drainage, erythema, or edema. Soft cervical collar in place  Disposition: Discharge disposition: 03-Skilled Nursing Facility       Discharge Instructions     Incentive spirometry RT   Complete by: As directed        Resume Eliquis on 04/30/2022 (1 week postop)   Allergies as of 04/28/2022   No Known Allergies      Medication List     STOP taking these medications    ibuprofen 200 MG tablet Commonly known as: ADVIL       TAKE these medications    acetaminophen 325 MG tablet Commonly known as: TYLENOL Take 650 mg by mouth every 6 (six) hours as needed for moderate pain.   amiodarone 200 MG tablet Commonly known as: PACERONE Take 100 mg by mouth daily.   amLODipine 10 MG tablet Commonly known as: NORVASC Take 10 mg by mouth daily.   calcitRIOL 0.25 MCG capsule Commonly known as: ROCALTROL Take 0.25 mcg by mouth every Monday, Wednesday, and Friday.   cyclobenzaprine 10 MG tablet Commonly known as: FLEXERIL Take 1 tablet (10 mg total) by mouth 3 (three) times daily as needed for muscle spasms.   cyclobenzaprine 10 MG tablet Commonly known as: FLEXERIL Take 1 tablet (10 mg total) by mouth 3 (three) times daily as needed for muscle spasms.   Eliquis 5 MG Tabs tablet Generic drug: apixaban Take 5 mg by mouth 2 (two) times daily.   finasteride 5 MG tablet Commonly known as: PROSCAR Take 5 mg by mouth daily.   furosemide 20  MG tablet Commonly known as: LASIX Take 20 mg by mouth daily.   metoprolol succinate 25 MG 24 hr tablet Commonly known as: TOPROL-XL Take 25 mg by mouth daily.   oxyCODONE-acetaminophen 5-325 MG tablet Commonly known as: Percocet Take 1 tablet by mouth every 4 (four) hours as needed for severe pain.   pantoprazole 40 MG  tablet Commonly known as: PROTONIX Take 40 mg by mouth daily.   tamsulosin 0.4 MG Caps capsule Commonly known as: FLOMAX Take 0.4 mg by mouth daily.   valsartan 40 MG tablet Commonly known as: DIOVAN Take 20 mg by mouth daily.        Signed: Council Mechanic 04/28/2022, 10:16 AM

## 2022-04-28 NOTE — Plan of Care (Signed)
Patient will D/C today to SNF

## 2022-04-28 NOTE — Progress Notes (Signed)
Physical Therapy Treatment Patient Details Name: Mario [name] MRN: [id number] DOB: 03-18-1951 Today's Date: 04/28/2022   History of Present Illness The pt is a 71 yo male presenting 9/6 with cervical stenosis with cord impingement, now s/p posterior arthrodesis of C2-3, C3-4, and C4-5 with laminectomy and segmental instrumentation.    PT Comments    Patient progressing slowly with safety still needing up to mod A at times for safety with ambulation due to pushing walker far forward and with flexed posture.  Noted foot deformities as well with pes planus and flexed knees almost crouch gait posture likely due to h/o stenosis. Feel he will benefit from follow up STSNF to progress gait, safety, postural strength and decrease fall risk.   Recommendations for follow up therapy are one component of a multi-disciplinary discharge planning process, led by the attending physician.  Recommendations may be updated based on patient status, additional functional criteria and insurance authorization.  Follow Up Recommendations  Skilled nursing-short term rehab (<3 hours/day) Can patient physically be transported by private vehicle: Yes   Assistance Recommended at Discharge Frequent or constant Supervision/Assistance  Patient can return home with the following A little help with walking and/or transfers;A little help with bathing/dressing/bathroom;Assistance with cooking/housework;Assistance with feeding;Direct supervision/assist for medications management;Direct supervision/assist for financial management;Assist for transportation;Help with stairs or ramp for entrance   Equipment Recommendations  Rolling walker (2 wheels);BSC/3in1    Recommendations for Other Services       Precautions / Restrictions Precautions Precautions: Fall;Cervical Required Braces or Orthoses: Cervical Brace Cervical Brace: Soft collar     Mobility  Bed Mobility Overal bed mobility: Needs Assistance Bed Mobility:  Supine to Sit     Supine to sit: Supervision     General bed mobility comments: S for safety    Transfers Overall transfer level: Needs assistance Equipment used: Rolling walker (2 wheels)   Sit to Stand: Min guard, Min assist           General transfer comment: initially minguard for balance, with sit<>stand practice min A as pt pulling up on walker and going quickly despite cues    Ambulation/Gait Ambulation/Gait assistance: Min assist, Mod assist Gait Distance (Feet): 150 Feet Assistive device: Rolling walker (2 wheels) Gait Pattern/deviations: Step-through pattern, Trunk flexed, Shuffle, Decreased stride length, Decreased dorsiflexion - left, Decreased dorsiflexion - right Gait velocity: too fast, unsafe pace     General Gait Details: both feet in pes planus and flexed posture keeping walker out in front so assist for walker safety and cues for posture; mod cues for L turns as pt consistently trying to turn R   Stairs             Wheelchair Mobility    Modified Rankin (Stroke Patients Only)       Balance Overall balance assessment: Needs assistance Sitting-balance support: Feet supported Sitting balance-[name]: Good     Standing balance support: Reliant on assistive device for balance, Bilateral upper extremity supported Standing balance-[name]: Poor Standing balance comment: UE support for balance                            Cognition Arousal/Alertness: Awake/alert Behavior During Therapy: Impulsive Overall Cognitive Status: History of cognitive impairments - at baseline Area of Impairment: Safety/judgement, Problem solving, Following commands                   Current Attention Level: Sustained Memory: Decreased recall of precautions,  Decreased short-term memory Following Commands: Follows one step commands inconsistently, Follows multi-step commands with increased time Safety/Judgement: Decreased awareness of  safety, Decreased awareness of deficits   Problem Solving: Slow processing, Requires verbal cues          Exercises Other Exercises Other Exercises: seated postural strengthening scapular squeezes x 10 w/ 5 sec hold with multimodal cues Other Exercises: sit<>stand x 5 Other Exercises: supine cervical retraction x 5 w/ 5 sec hold    General Comments General comments (skin integrity, edema, etc.): VSS      Pertinent Vitals/Pain Pain Assessment Faces Pain Scale: Hurts even more Pain Location: throat, neck Pain Descriptors / Indicators: Discomfort Pain Intervention(s): Monitored during session, Limited activity within patient's tolerance, Repositioned    Home Living                          Prior Function            PT Goals (current goals can now be found in the care plan section) Progress towards PT goals: Progressing toward goals    Frequency    Min 5X/week      PT Plan Current plan remains appropriate    Co-evaluation              AM-PAC PT "6 Clicks" Mobility   Outcome Measure  Help needed turning from your back to your side while in a flat bed without using bedrails?: A Little Help needed moving from lying on your back to sitting on the side of a flat bed without using bedrails?: A Little Help needed moving to and from a bed to a chair (including a wheelchair)?: A Little Help needed standing up from a chair using your arms (e.g., wheelchair or bedside chair)?: A Little Help needed to walk in hospital room?: A Lot Help needed climbing 3-5 steps with a railing? : Total 6 Click Score: 15    End of Session Equipment Utilized During Treatment: Gait belt;Cervical collar Activity Tolerance: Patient tolerated treatment well Patient left: in bed;with call bell/phone within reach;with bed alarm set   PT Visit Diagnosis: Other abnormalities of gait and mobility (R26.89);Repeated falls (R29.6);Muscle weakness (generalized) (M62.81);History of  falling (Z91.81)     Time: 7322-0254 PT Time Calculation (min) (ACUTE ONLY): 25 min  Charges:  $Gait Training: 8-22 mins $Therapeutic Exercise: 8-22 mins                     Sheran Lawless, PT Acute Rehabilitation Services [phone] 04/28/2022    Mario [name] 04/28/2022, 1:25 PM

## 2022-05-06 ENCOUNTER — Encounter (HOSPITAL_COMMUNITY): Payer: Self-pay | Admitting: Neurosurgery

## 2022-05-27 ENCOUNTER — Emergency Department (HOSPITAL_COMMUNITY)
Admission: EM | Admit: 2022-05-27 | Discharge: 2022-05-28 | Disposition: A | Discharge status: Rehabilitation Facility | Payer: Medicare Other | Attending: Emergency Medicine | Admitting: Emergency Medicine

## 2022-05-27 ENCOUNTER — Encounter (HOSPITAL_COMMUNITY): Payer: Self-pay

## 2022-05-27 ENCOUNTER — Other Ambulatory Visit: Payer: Self-pay

## 2022-05-27 ENCOUNTER — Emergency Department (HOSPITAL_COMMUNITY): Payer: Medicare Other

## 2022-05-27 DIAGNOSIS — Z7901 Long term (current) use of anticoagulants: Secondary | ICD-10-CM | POA: Insufficient documentation

## 2022-05-27 DIAGNOSIS — I130 Hypertensive heart and chronic kidney disease with heart failure and stage 1 through stage 4 chronic kidney disease, or unspecified chronic kidney disease: Secondary | ICD-10-CM | POA: Insufficient documentation

## 2022-05-27 DIAGNOSIS — G8918 Other acute postprocedural pain: Secondary | ICD-10-CM | POA: Diagnosis not present

## 2022-05-27 DIAGNOSIS — R531 Weakness: Secondary | ICD-10-CM | POA: Diagnosis present

## 2022-05-27 DIAGNOSIS — I509 Heart failure, unspecified: Secondary | ICD-10-CM | POA: Diagnosis not present

## 2022-05-27 DIAGNOSIS — N189 Chronic kidney disease, unspecified: Secondary | ICD-10-CM | POA: Insufficient documentation

## 2022-05-27 DIAGNOSIS — Z20822 Contact with and (suspected) exposure to covid-19: Secondary | ICD-10-CM | POA: Diagnosis not present

## 2022-05-27 DIAGNOSIS — M79602 Pain in left arm: Secondary | ICD-10-CM | POA: Insufficient documentation

## 2022-05-27 DIAGNOSIS — M79601 Pain in right arm: Secondary | ICD-10-CM | POA: Insufficient documentation

## 2022-05-27 DIAGNOSIS — Z79899 Other long term (current) drug therapy: Secondary | ICD-10-CM | POA: Diagnosis not present

## 2022-05-27 DIAGNOSIS — S129XXA Fracture of neck, unspecified, initial encounter: Secondary | ICD-10-CM

## 2022-05-27 LAB — CBC WITH DIFFERENTIAL/PLATELET
Abs Immature Granulocytes: 0.02 10*3/uL (ref 0.00–0.07)
Basophils Absolute: 0 10*3/uL (ref 0.0–0.1)
Basophils Relative: 1 %
Eosinophils Absolute: 0 10*3/uL (ref 0.0–0.5)
Eosinophils Relative: 1 %
HCT: 40.3 % (ref 39.0–52.0)
Hemoglobin: 12.9 g/dL — ABNORMAL LOW (ref 13.0–17.0)
Immature Granulocytes: 0 %
Lymphocytes Relative: 12 %
Lymphs Abs: 0.8 10*3/uL (ref 0.7–4.0)
MCH: 26.1 pg (ref 26.0–34.0)
MCHC: 32 g/dL (ref 30.0–36.0)
MCV: 81.6 fL (ref 80.0–100.0)
Monocytes Absolute: 0.6 10*3/uL (ref 0.1–1.0)
Monocytes Relative: 9 %
Neutro Abs: 5.1 10*3/uL (ref 1.7–7.7)
Neutrophils Relative %: 77 %
Platelets: 171 10*3/uL (ref 150–400)
RBC: 4.94 MIL/uL (ref 4.22–5.81)
RDW: 14.4 % (ref 11.5–15.5)
WBC: 6.6 10*3/uL (ref 4.0–10.5)
nRBC: 0 % (ref 0.0–0.2)

## 2022-05-27 LAB — URINALYSIS, ROUTINE W REFLEX MICROSCOPIC
Bilirubin Urine: NEGATIVE
Glucose, UA: NEGATIVE mg/dL
Hgb urine dipstick: NEGATIVE
Ketones, ur: 20 mg/dL — AB
Leukocytes,Ua: NEGATIVE
Nitrite: NEGATIVE
Protein, ur: NEGATIVE mg/dL
Specific Gravity, Urine: 1.018 (ref 1.005–1.030)
pH: 5 (ref 5.0–8.0)

## 2022-05-27 LAB — COMPREHENSIVE METABOLIC PANEL
ALT: 16 U/L (ref 0–44)
AST: 21 U/L (ref 15–41)
Albumin: 3.8 g/dL (ref 3.5–5.0)
Alkaline Phosphatase: 75 U/L (ref 38–126)
Anion gap: 11 (ref 5–15)
BUN: 26 mg/dL — ABNORMAL HIGH (ref 8–23)
CO2: 23 mmol/L (ref 22–32)
Calcium: 9.4 mg/dL (ref 8.9–10.3)
Chloride: 105 mmol/L (ref 98–111)
Creatinine, Ser: 1.63 mg/dL — ABNORMAL HIGH (ref 0.61–1.24)
GFR, Estimated: 44 mL/min — ABNORMAL LOW (ref >60)
Glucose, Bld: 87 mg/dL (ref 70–99)
Potassium: 3.9 mmol/L (ref 3.5–5.1)
Sodium: 139 mmol/L (ref 135–145)
Total Bilirubin: 0.9 mg/dL (ref 0.3–1.2)
Total Protein: 7.4 g/dL (ref 6.5–8.1)

## 2022-05-27 LAB — MAGNESIUM: Magnesium: 2.1 mg/dL (ref 1.7–2.4)

## 2022-05-27 NOTE — Progress Notes (Addendum)
TOC  CSW is currently awaiting a call from Santiago Glad at [facility].  Mario [name], MSW, LCSW-A Pronouns:  She/Her/Hers [facility] of Care Clinical Social Worker Direct Number:  [phone] [email or web address]

## 2022-05-27 NOTE — Progress Notes (Signed)
TOC CSW spoke with Santiago Glad in regards to accepting pt back to [facility]. [phone].  Santiago Glad stated she would return my call after looking into pts return.  Beyonca Wisz Tarpley-Carter, MSW, LCSW-A Pronouns:  She/Her/Hers [facility] of Care Clinical Social Worker Direct Number:  [phone] [email or web address]

## 2022-05-27 NOTE — ED Provider Triage Note (Signed)
Emergency Medicine Provider Triage Evaluation Note  Mario [name] , a 71 y.o. male  was evaluated in triage.  Pt complains of upper extremity weakness and pain.  Patient had cervical fusion performed at beginning of September and has been in rehab.  Patient states that has not been able to lift his arms for 2 weeks.  Unable to feed or care for himself.  Patient has untreated CP.  Patient denies any chest pain or headache.  Denies any fevers or chills.  Denies any neck pain.  Review of Systems  Positive: Upper extremity weakness Negative: Fever, chest pain, vomiting, headache  Physical Exam  BP (!) 131/95 (BP Location: Right Arm)   Pulse 87   Temp 98 F (36.7 C) (Oral)   Resp 16   SpO2 100%  Gen:   Awake, no distress   Resp:  Normal effort  MSK:   Moves extremities without difficulty  Other:  Patient has inability to abduct the bilateral shoulders.  Patient has 5 out of 5 strength with flexion extension of the elbows and wrist.  Sensation is intact  Patient answers questions appropriately.  Smile symmetric.  Tongue protrudes midline.  Radial pulse are 2+.  Medical Decision Making  Medically screening exam initiated at 12:52 PM.  Appropriate orders placed.  [name] was informed that the remainder of the evaluation will be completed by another provider, this initial triage assessment does not replace that evaluation, and the importance of remaining in the ED until their evaluation is complete.  Patient with upper extremity weakness for 2 weeks in the setting of recent cervical spine surgery September 6 by Dr. Marcello Moores.  Patient is neurovascularly intact.  Basic labs and imaging pending at this time.   Doristine Devoid, PA-C 05/27/22 1256

## 2022-05-27 NOTE — Progress Notes (Signed)
TOC CSW in an attempt to contact [facility] to find out if pt could return.  They do not accept pt with Admin having knowledge of it.  Admin will return in the morning.  CSW for 1st shift will be notified.    Bernal Luhman Tarpley-Carter, MSW, LCSW-A Pronouns:  She/Her/Hers [facility] Care Clinical Social Worker Direct Number:  [phone] [email or web address]

## 2022-05-27 NOTE — ED Notes (Signed)
Called pt X5 for reg and for vitals no answer PT left hospital.

## 2022-05-27 NOTE — ED Notes (Signed)
Pt has not left the hospital. Pt is currently waiting in triage.

## 2022-05-27 NOTE — ED Triage Notes (Signed)
Patient has cervical spine surgery sept 6 by Dr Marcello Moores.  Patient has not been able to lift his arms for two weeks.  Unable to feed or care for self.  Patient walks with a walker but can only bend at the elbows.  Patient has untreated CP.  Patient is hard to answer questions.  Usually patient doesn't ask for help and today when home medic got there he called out for help. Complaining of pain down bilateral arms.

## 2022-05-27 NOTE — ED Notes (Signed)
Patient transported to CT 

## 2022-05-27 NOTE — ED Provider Notes (Signed)
[facility] EMERGENCY DEPARTMENT Provider Note   CSN: [id number] Arrival date & time: 05/27/22  1235     History  Chief Complaint  Patient presents with   Transitions Of Care   Post-op Problem    Mario [name] is a 71 y.o. male.  HPI  Patient with past medical history of hypertension, hyperlipidemia, CKD, CHF, cerebral palsy, recent cervical spine fusion, A-fib on Eliquis presents today due to upper extremity weakness bilaterally.  States this actually started before his surgery but worsened in the last 2 weeks.  He had cervical spine fusion December 6, he was fine after that but she weeks ago he has been unable to raise his arms because his upper arms hurt when this happens.  States she is not eating and drinking secondary to arm weakness.  He denies any urinary retention, fecal incontinence, hematuria, saddle anesthesia, sensation deficits.  He feels like his right upper extremity is slightly weaker than the left, no vision changes and he actually denies any neck pain.  He denies any recent falls or injuries to his neck.  Home Medications Prior to Admission medications   Medication Sig Start Date End Date Taking? Authorizing Provider  acetaminophen (TYLENOL) 325 MG tablet Take 650 mg by mouth every 6 (six) hours as needed for moderate pain.    [provider]  amiodarone (PACERONE) 200 MG tablet Take 100 mg by mouth daily. 05/07/21   [provider]  amLODipine (NORVASC) 10 MG tablet Take 10 mg by mouth daily.    [provider]  calcitRIOL (ROCALTROL) 0.25 MCG capsule Take 0.25 mcg by mouth every Monday, Wednesday, and Friday.    [provider]  cyclobenzaprine (FLEXERIL) 10 MG tablet Take 1 tablet (10 mg total) by mouth 3 (three) times daily as needed for muscle spasms. 04/28/22   Marvis Moeller, NP  cyclobenzaprine (FLEXERIL) 10 MG tablet Take 1 tablet (10 mg total) by mouth 3 (three) times daily as needed for muscle  spasms. 04/28/22   Marvis Moeller, NP  ELIQUIS 5 MG TABS tablet Take 5 mg by mouth 2 (two) times daily. 05/07/21   [provider]  finasteride (PROSCAR) 5 MG tablet Take 5 mg by mouth daily. 07/01/21   [provider]  furosemide (LASIX) 20 MG tablet Take 20 mg by mouth daily. 06/28/20   [provider]  metoprolol succinate (TOPROL-XL) 25 MG 24 hr tablet Take 25 mg by mouth daily. 06/28/20   [provider]  oxyCODONE-acetaminophen (PERCOCET) 5-325 MG tablet Take 1 tablet by mouth every 4 (four) hours as needed for severe pain. 04/28/22 04/28/23  Marvis Moeller, NP  pantoprazole (PROTONIX) 40 MG tablet Take 40 mg by mouth daily. 06/28/20   [provider]  tamsulosin (FLOMAX) 0.4 MG CAPS capsule Take 0.4 mg by mouth daily. 07/01/21   [provider]  valsartan (DIOVAN) 40 MG tablet Take 20 mg by mouth daily.    [provider]      Allergies    Patient has no known allergies.    Review of Systems   Review of Systems  Physical Exam Updated Vital Signs BP (!) 133/98 (BP Location: Left Arm)   Pulse 82   Temp 98.2 F (36.8 C) (Oral)   Resp 15   SpO2 100%  Physical Exam Vitals and nursing note reviewed. Exam conducted with a chaperone present.  Constitutional:      Appearance: Normal appearance.  HENT:     Head:  Normocephalic and atraumatic.  Eyes:     General: No scleral icterus.       Right eye: No discharge.        Left eye: No discharge.     Extraocular Movements: Extraocular movements intact.     Pupils: Pupils are equal, round, and reactive to light.  Neck:     Comments: Full ROM to cervical spine.  No palpable step-offs, no reproducible cervical spine tenderness with palpation. Cardiovascular:     Rate and Rhythm: Normal rate and regular rhythm.     Pulses: Normal pulses.     Heart sounds: Normal heart sounds. No murmur heard.    No friction rub. No gallop.  Pulmonary:     Effort: Pulmonary effort is  normal. No respiratory distress.     Breath sounds: Normal breath sounds.  Abdominal:     General: Abdomen is flat. Bowel sounds are normal. There is no distension.     Palpations: Abdomen is soft.     Tenderness: There is no abdominal tenderness.  Musculoskeletal:     Cervical back: Normal range of motion. No tenderness.  Skin:    General: Skin is warm and dry.     Capillary Refill: Capillary refill takes less than 2 seconds.     Coloration: Skin is not jaundiced.  Neurological:     Mental Status: He is alert. Mental status is at baseline.     Motor: Weakness present.     Coordination: Coordination normal.     Comments: Cranial nerves II through XII are grossly intact.  Tongue is midline, patient is able to shrug shoulders which is symmetric bilaterally.  Right upper extremity is slightly weaker compared to the left.  Unable to raise bilateral upper extremities above shoulder secondary to pain.  Able to raise both lower extremities without difficulty, plantarflexion dorsiflexion is symmetric on both sides.  Normal proprioception.  No sensation deficit to upper or lower extremities.     ED Results / Procedures / Treatments   Labs (all labs ordered are listed, but only abnormal results are displayed) Labs Reviewed  CBC WITH DIFFERENTIAL/PLATELET - Abnormal; Notable for the following components:      Result Value   Hemoglobin 12.9 (*)    All other components within normal limits  COMPREHENSIVE METABOLIC PANEL - Abnormal; Notable for the following components:   BUN 26 (*)    Creatinine, Ser 1.63 (*)    GFR, Estimated 44 (*)    All other components within normal limits  URINALYSIS, ROUTINE W REFLEX MICROSCOPIC - Abnormal; Notable for the following components:   Ketones, ur 20 (*)    All other components within normal limits  MAGNESIUM    EKG None  Radiology CT Cervical Spine Wo Contrast  Result Date: 05/27/2022 CLINICAL DATA:  Spinal stenosis, cervical upper extremity  weakness in the setting of cervical fusion performed last month EXAM: CT CERVICAL SPINE WITHOUT CONTRAST TECHNIQUE: Multidetector CT imaging of the cervical spine was performed without intravenous contrast. Multiplanar CT image reconstructions were also generated. RADIATION DOSE REDUCTION: This exam was performed according to the departmental dose-optimization program which includes automated exposure control, adjustment of the mA and/or kV according to patient size and/or use of iterative reconstruction technique. COMPARISON:  MRI July 15, 2021. FINDINGS: Alignment: Mild reversal the normal cervical lordosis. Skull base and vertebrae: Posterior fusion spanning from C2 to C5 with posterior decompression. Potentially acute and slightly distracted fracture of the C5 spinous process. Vertebral body heights are maintained.  Soft tissues and spinal canal: No prevertebral fluid or swelling. No visible canal hematoma. Disc levels: Multilevel degenerative change, better characterized on prior MRI. Upper chest: Biapical pleuroparenchymal scarring. IMPRESSION: Potentially acute and slightly distracted fracture of the C5 spinous process, new since November 2022. Electronically Signed   By: Feliberto Harts M.D.   On: 05/27/2022 13:33    Procedures Procedures    Medications Ordered in ED Medications - No data to display  ED Course/ Medical Decision Making/ A&P Clinical Course as of 05/27/22 2134  Wed May 27, 2022  2002 I spoke with Dr. Franky Macho on-call with neurosurgery given the CT scan findings, recent cervical spine fusion and upper extremity weakness.  He states this is not a cord infarct.  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  Other atypical to be on both sides of his upper extremity.  From a neurosurgery perspective patient is stable to follow-up in the office with Dr. Maisie Fus tomorrow.  Does not recommend an MR, does not recommend holding Eliquis. [HS]  2113 Per  community paramedical CP [phone] -living independently, no running water or electric (burns wood).  [HS]    Clinical Course User Index [HS] Theron Arista, PA-C                           Medical Decision Making Amount and/or Complexity of Data Reviewed Radiology: ordered.   Patient presents due to bilateral upper extremity weakness/pain after cervical spine fusion.  Differential includes but not limited to cord compression, fracture, dislocation, nerve palsy, sepsis, postop pain.  Patient has limited ROM of upper extremity secondary to pain.  Right upper extremity is weaker compared to the left.  Radial pulse 2+, cap refills less than 2.  Remainder of exam is without any deficits.  I consulted neurosurgery as documented in the ED course.  Consulted social work as documented, patient is not a candidate for SNF without a 3-day hospitalization.  I ordered and viewed laboratory work-up. CBC with no leukocytosis.  Stable anemia with a hemoglobin of 12.9 which is actually improved compared to patient's office visit last month.   CMP - creatinine is roughly at baseline at 1.63.  No gross electrolyte derangement or metabolic abnormality.   UA so some mild ketonuria.  CT cervical spine ordered in triage reviewed by myself.  Agree with radiologist interpretation of C5 spinous fracture.  Atraumatic, no reproducible tenderness on exam.  Does not involve the hardware.  I considered admission but I do not have an admittable diagnosis.    Social work was consulted, unable to set patient up with SNF at this juncture.  I also consulted with neurosurgery he with advised patient to follow-up with Dr. Maisie Fus tomorrow but not much to do emergently and patient does not need indication for MRI.  Patient has been caring for himself at home and this upper extremity weakness seems to actually been going on since prior to the surgery so not sure is actually attributable to a postop complication versus  uncontrolled cerebral palsy.  There is no signs of cord compression, patient is not septic and does not appear to have infectious symptoms such as fever, leukocytosis that be supportive of a postop abscess or complication.  Patient was eating and drinking in the emergency department without any assistance..  Although report of him not eating and drinking well at home his kidney functions actually improved, there is no metabolic abnormality  and he has no new AKI or worsening renal impairment.  Initially my plan was to send the patient home with outpatient neurology follow-up.  However, patient's emergency contact her Mario [name] who is also a paramedic taking care of cerebral palsy in patient's home area called with concerns about patient's safety.  Patient lives in a home with no working Therapist, nutritional.  He is not able to prepare meals for himself, he does not have running water and his home and is dependent on ambulation to collect water from the church nearby.  Additionally he uses an out house on the property.  Very strong concerns that patient is not safe to return back to his current living situation.  She discussed that [facility] rehab would likely accept the patient back and she was trying to have that occurred today instead of coming to the ED but was unable to coordinate admission.  I will reconsult our social work team and see if that is a possibility.  I spoke with Mario [name] -she states given patient was recently admitted there and just discharged a few days ago they likely would be able to get the patient admitted there but it is too late in the evening to discuss admission.  Therefore patient will board in the emergency department overnight with plans for likely admission to [facility] rehabilitation in the morning.  Appreciate her help coordinating care of this patient.  I have ordered diet.  Home meds have not been reconciled yet.  Discussed HPI, physical exam and plan of care  for this patient with attending Glendale Chard.. The attending physician evaluated this patient as part of a shared visit and agrees with plan of care.         Final Clinical Impression(s) / ED Diagnoses Final diagnoses:  None    Rx / DC Orders ED Discharge Orders     None         Sherrill Raring, Hershal Coria 05/27/22 2241    Fransico Meadow, MD 05/28/22 [phone]

## 2022-05-27 NOTE — Discharge Instructions (Addendum)
You are seen today in the emergency department for weakness.  We think it is most likely a nerve palsy from the fusion you had a month ago.  Please follow-up with Dr. Marcello Moores in the office , call first thing in the morning to be seen by the end of the day.  You do have a small fracture of the C5 spinous process, you do not need any brace or additional treatment for this.  Continue taking your home medication.

## 2022-05-28 DIAGNOSIS — R531 Weakness: Secondary | ICD-10-CM | POA: Diagnosis not present

## 2022-05-28 LAB — RESP PANEL BY RT-PCR (FLU A&B, COVID) ARPGX2
Influenza A by PCR: NEGATIVE
Influenza B by PCR: NEGATIVE
SARS Coronavirus 2 by RT PCR: NEGATIVE

## 2022-05-28 MED ORDER — FUROSEMIDE 20 MG PO TABS
20.0000 mg | ORAL_TABLET | Freq: Every day | ORAL | Status: DC
  Administered 2022-05-28: 20 mg via ORAL
  Filled 2022-05-28: qty 1

## 2022-05-28 MED ORDER — TAMSULOSIN HCL 0.4 MG PO CAPS
0.4000 mg | ORAL_CAPSULE | Freq: Every day | ORAL | Status: DC
  Filled 2022-05-28: qty 1

## 2022-05-28 MED ORDER — AMIODARONE HCL 200 MG PO TABS
100.0000 mg | ORAL_TABLET | Freq: Every day | ORAL | Status: DC
  Administered 2022-05-28: 100 mg via ORAL
  Filled 2022-05-28: qty 1

## 2022-05-28 MED ORDER — PANTOPRAZOLE SODIUM 40 MG PO TBEC
40.0000 mg | DELAYED_RELEASE_TABLET | Freq: Every day | ORAL | Status: DC
  Administered 2022-05-28: 40 mg via ORAL
  Filled 2022-05-28: qty 1

## 2022-05-28 MED ORDER — CALCITRIOL 0.25 MCG PO CAPS: 0.2500 ug | ORAL_CAPSULE | ORAL | Status: DC

## 2022-05-28 MED ORDER — CYCLOBENZAPRINE HCL 10 MG PO TABS: 10.0000 mg | ORAL_TABLET | Freq: Three times a day (TID) | ORAL | Status: DC | PRN

## 2022-05-28 MED ORDER — ACETAMINOPHEN 325 MG PO TABS: 650.0000 mg | ORAL_TABLET | Freq: Four times a day (QID) | ORAL | Status: DC | PRN

## 2022-05-28 MED ORDER — IRBESARTAN 75 MG PO TABS
37.5000 mg | ORAL_TABLET | Freq: Every day | ORAL | Status: DC
  Filled 2022-05-28: qty 0.5

## 2022-05-28 MED ORDER — METOPROLOL SUCCINATE ER 25 MG PO TB24
25.0000 mg | ORAL_TABLET | Freq: Every day | ORAL | Status: DC
  Administered 2022-05-28: 25 mg via ORAL
  Filled 2022-05-28: qty 1

## 2022-05-28 MED ORDER — FINASTERIDE 5 MG PO TABS
5.0000 mg | ORAL_TABLET | Freq: Every day | ORAL | Status: DC
  Filled 2022-05-28: qty 1

## 2022-05-28 MED ORDER — AMLODIPINE BESYLATE 5 MG PO TABS
10.0000 mg | ORAL_TABLET | Freq: Every day | ORAL | Status: DC
  Administered 2022-05-28: 10 mg via ORAL
  Filled 2022-05-28: qty 2

## 2022-05-28 MED ORDER — APIXABAN 5 MG PO TABS
5.0000 mg | ORAL_TABLET | Freq: Two times a day (BID) | ORAL | Status: DC
  Administered 2022-05-28: 5 mg via ORAL
  Filled 2022-05-28: qty 1

## 2022-05-28 NOTE — Progress Notes (Addendum)
CSW spoke with Mario [name] at [facility] who states the patient can return to the facility today.   The patient will be transported via [facility] to call when ready. Patient will go to room 504-B.   The number to call for report is [phone].  Madilyn Fireman, MSW, LCSW Transitions of Care  Clinical Social Worker II [phone]

## 2022-05-28 NOTE — ED Provider Notes (Signed)
Emergency Medicine Observation Re-evaluation Note  Mario [name] is a 71 y.o. male, seen on rounds today.  Pt initially presented to the ED for complaints of Transitions Of Care and Post-op Problem Currently, the patient is resting in bed watching TV.  Physical Exam  BP 128/86 (BP Location: Left Arm)   Pulse 80   Temp 98.1 F (36.7 C)   Resp 18   SpO2 99%  Physical Exam General: Calm and resting in bed watching TV Cardiac: Regular rate Lungs: Breathing comfortably Psych: Calm and not agitated  ED Course / MDM  EKG:   I have reviewed the labs performed to date as well as medications administered while in observation.  Recent changes in the last 24 hours include observation, medication reconciliation, social work working on getting patient back to rehab.  Plan  Current plan is for social work working on rehab placement.    Elgie Congo, MD 05/28/22 [phone]

## 2022-05-28 NOTE — ED Notes (Signed)
Report called to [facility]

## 2022-05-28 NOTE — ED Notes (Signed)
[facility] CALLED FOR PT NO ETA GIVEN

## 2022-05-28 NOTE — NC FL2 (Signed)
[place] MEDICAID FL2 LEVEL OF CARE SCREENING TOOL     IDENTIFICATION  Patient Name: Mario [name] Birthdate: [birth year] Sex: male Admission Date (Current Location): 05/27/2022  [facility] and [place] Number:  Herbalist and Address:  [facility], [place] 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   Spinal stenosis in cervical region 04/22/2022   Anemia 07/01/2021   Encounter for screening colonoscopy 07/01/2021    Orientation RESPIRATION BLADDER Height & Weight     Self, Time, Place, Situation  Normal Continent Weight: 163 lb 2.3 oz (74 kg) Height:     BEHAVIORAL SYMPTOMS/MOOD NEUROLOGICAL BOWEL NUTRITION STATUS      Continent Diet (Normal)  AMBULATORY STATUS COMMUNICATION OF NEEDS Skin   Extensive Assist Verbally Normal                       Personal Care Assistance Level of Assistance  Bathing, Feeding, Total care, Dressing Bathing Assistance: Limited assistance Feeding assistance: Limited assistance Dressing Assistance: Limited assistance     Functional Limitations Info  Sight, Hearing, Speech Sight Info: Adequate Hearing Info: Adequate Speech Info: Adequate    SPECIAL CARE FACTORS FREQUENCY  OT (By licensed OT), PT (By licensed PT)     PT Frequency: 5x weekly OT Frequency: 5x weekly            Contractures Contractures Info: Not present    Additional Factors Info  Code Status, Allergies Code Status Info: Full Code Allergies Info: No known allergies            Current Medications (05/28/2022):  This is the current hospital active medication list Current Facility-Administered Medications  Medication Dose Route Frequency Provider Last Rate Last Admin   acetaminophen (TYLENOL) tablet 650 mg  650 mg Oral Q6H PRN Elgie Congo, MD       amiodarone (PACERONE) tablet 100 mg  100 mg Oral Daily Elgie Congo, MD       amLODipine (NORVASC) tablet 10 mg  10 mg Oral Daily Elgie Congo, MD       apixaban Arne Cleveland) tablet 5 mg  5 mg Oral BID Elgie Congo, MD       [START ON 05/29/2022] calcitRIOL (ROCALTROL) capsule 0.25 mcg  0.25 mcg Oral Q M,W,F Elgie Congo, MD       cyclobenzaprine (FLEXERIL) tablet 10 mg  10 mg Oral TID PRN Elgie Congo, MD       finasteride (PROSCAR) tablet 5 mg  5 mg Oral Daily Elgie Congo, MD       furosemide (LASIX) tablet 20 mg  20 mg Oral Daily Elgie Congo, MD       irbesartan (AVAPRO) tablet 37.5 mg  37.5 mg Oral Daily Georgina Snell C, MD       metoprolol succinate (TOPROL-XL) 24 hr tablet 25 mg  25 mg Oral Daily Georgina Snell C, MD       pantoprazole (PROTONIX) EC tablet 40 mg  40  mg Oral Daily Mardene Sayer, MD       tamsulosin [facility]) capsule 0.4 mg  0.4 mg Oral Daily Mardene Sayer, MD       Current Outpatient Medications  Medication Sig Dispense Refill   acetaminophen (TYLENOL) 325 MG tablet Take 650 mg by mouth every 6 (six) hours as needed for moderate pain.     amiodarone (PACERONE) 200 MG tablet Take 100 mg by mouth daily.     amLODipine (NORVASC) 10 MG tablet Take 10 mg by mouth daily.     calcitRIOL (ROCALTROL) 0.25 MCG capsule Take 0.25 mcg by mouth every Monday, Wednesday, and Friday.     cyclobenzaprine (FLEXERIL) 10 MG tablet Take 1 tablet (10 mg total) by mouth 3 (three) times daily as needed for muscle spasms. 60 tablet 2   ELIQUIS 5 MG TABS tablet Take 5 mg by mouth 2 (two) times daily.     finasteride (PROSCAR) 5 MG tablet Take 5 mg  by mouth daily.     furosemide (LASIX) 20 MG tablet Take 20 mg by mouth daily.     metoprolol succinate (TOPROL-XL) 25 MG 24 hr tablet Take 25 mg by mouth daily.     pantoprazole (PROTONIX) 40 MG tablet Take 40 mg by mouth daily.     tamsulosin (FLOMAX) 0.4 MG CAPS capsule Take 0.4 mg by mouth daily.     valsartan (DIOVAN) 40 MG tablet Take 20 mg by mouth daily.       Discharge Medications: Please see discharge summary for a list of discharge medications.  Relevant Imaging Results:  Relevant Lab Results:   Additional Information SSN: [id number]  Inis Sizer, LCSW

## 2022-05-28 NOTE — ED Notes (Signed)
Pt was able to feed himself

## 2022-07-15 ENCOUNTER — Other Ambulatory Visit (HOSPITAL_COMMUNITY): Payer: Self-pay | Admitting: Neurosurgery

## 2022-07-15 DIAGNOSIS — M48062 Spinal stenosis, lumbar region with neurogenic claudication: Secondary | ICD-10-CM

## 2022-08-04 ENCOUNTER — Ambulatory Visit (HOSPITAL_COMMUNITY)
Admission: RE | Admit: 2022-08-04 | Discharge: 2022-08-04 | Disposition: A | Discharge status: Home / Self Care | Payer: Medicare Other | Source: Ambulatory Visit | Attending: Neurosurgery | Admitting: Neurosurgery

## 2022-08-04 DIAGNOSIS — M48062 Spinal stenosis, lumbar region with neurogenic claudication: Secondary | ICD-10-CM | POA: Insufficient documentation

## 2022-08-21 ENCOUNTER — Ambulatory Visit (HOSPITAL_COMMUNITY)
Admission: RE | Admit: 2022-08-21 | Discharge: 2022-08-21 | Disposition: A | Discharge status: Home / Self Care | Payer: Medicare Other | Source: Ambulatory Visit | Attending: Internal Medicine | Admitting: Internal Medicine

## 2022-08-21 ENCOUNTER — Ambulatory Visit (INDEPENDENT_AMBULATORY_CARE_PROVIDER_SITE_OTHER): Payer: Medicare Other | Admitting: Internal Medicine

## 2022-08-21 ENCOUNTER — Encounter: Payer: Self-pay | Admitting: Internal Medicine

## 2022-08-21 VITALS — BP 134/62 | HR 90 | Temp 97.6°F | Ht 68.0 in | Wt 154.8 lb

## 2022-08-21 DIAGNOSIS — R0609 Other forms of dyspnea: Secondary | ICD-10-CM | POA: Insufficient documentation

## 2022-08-21 NOTE — Patient Instructions (Signed)
Please remember to go to the lab department   for your tests - we will call you with the results when they are available.      Please remember to go to the  x-ray department  @  [facility] for your tests - we will call you with the results when they are available     Will decide on whether follow up is needed based on above results and go from there.

## 2022-08-21 NOTE — Progress Notes (Unsigned)
Mario [name], male    DOB: [birth year]    MRN: [id number]   Brief patient profile:  57  yobm  with CP  quit smoking 1985 s apparent sequelae  referred to pulmonary clinic in [place]  08/21/2022 by Gin a Theda Sers  for doe noted in 2000 ? Worse p neck surgery Sept 2023     History of Present Illness  08/21/2022  Pulmonary/ 1st office eval/ Rajanee Schuelke / [employer] [facility]  Chief Complaint  Patient presents with   Consult    Low O2 sats   Dyspnea:  50 ft to mb level  Cough: none  Sleep: lie flat fine/ 2 pillow SABA use: none  02: none   No obvious day to day or daytime pattern/variability or assoc excess/ purulent sputum or mucus plugs or hemoptysis or cp or chest tightness, subjective wheeze or overt sinus or hb symptoms.   Sleeping  without nocturnal  or early am exacerbation  of respiratory  c/o's or need for noct saba. Also denies any obvious fluctuation of symptoms with weather or environmental changes or other aggravating or alleviating factors except as outlined above   No unusual exposure hx or h/o childhood pna/ asthma or knowledge of premature birth.  Current Allergies, Complete Past Medical History, Past Surgical History, Family History, and Social History were reviewed in [employer] record.  ROS  The following are not active complaints unless bolded Hoarseness, sore throat, dysphagia, dental problems, itching, sneezing,  nasal congestion or discharge of excess mucus or purulent secretions, ear ache,   fever, chills, sweats, unintended wt loss or wt gain, classically pleuritic or exertional cp,  orthopnea pnd or arm/hand swelling  or leg swelling, presyncope, palpitations, abdominal pain, anorexia, nausea, vomiting, diarrhea  or change in bowel habits or change in bladder habits, change in stools or change in urine, dysuria, hematuria,  rash, arthralgias, visual complaints, headache, numbness, weakness or ataxia or problems with walking or coordination/2  wheeled walker uses sometimes ,  change in mood or  memory.             Past Medical History:  Diagnosis Date   Anemia    Arthritis    Atrial fibrillation (HCC)    CHF (congestive heart failure) (HCC)    CKD (chronic kidney disease)    Dyspnea    r/t heart failure   Dysrhythmia    A.Fib/A.Flutter   HTN (hypertension)    PE (pulmonary thromboembolism) ([place]) 10/17/2019   acute PE in setting of new onset aflutter    Outpatient Medications Prior to Visit  Medication Sig Dispense Refill   acetaminophen (TYLENOL) 325 MG tablet Take 650 mg by mouth every 6 (six) hours as needed for moderate pain.     amiodarone (PACERONE) 200 MG tablet Take 100 mg by mouth daily.     amLODipine (NORVASC) 10 MG tablet Take 10 mg by mouth daily.     calcitRIOL (ROCALTROL) 0.25 MCG capsule Take 0.25 mcg by mouth every Monday, Wednesday, and Friday.     cyclobenzaprine (FLEXERIL) 10 MG tablet Take 1 tablet (10 mg total) by mouth 3 (three) times daily as needed for muscle spasms. 60 tablet 2   ELIQUIS 5 MG TABS tablet Take 5 mg by mouth 2 (two) times daily.     finasteride (PROSCAR) 5 MG tablet Take 5 mg by mouth daily.     furosemide (LASIX) 20 MG tablet Take 20 mg by mouth daily.     metoprolol succinate (TOPROL-XL) 25  MG 24 hr tablet Take 25 mg by mouth daily.     pantoprazole (PROTONIX) 40 MG tablet Take 40 mg by mouth daily.     tamsulosin (FLOMAX) 0.4 MG CAPS capsule Take 0.4 mg by mouth daily.     valsartan (DIOVAN) 40 MG tablet Take 20 mg by mouth daily.     No facility-administered medications prior to visit.     Objective:     BP 134/62   Pulse 90   Temp 97.6 F (36.4 C)   Ht 5\' 8"  (1.727 m)   Wt 154 lb 12.8 oz (70.2 kg)   SpO2 96% Comment: ra  BMI 23.54 kg/m   SpO2: 96 % (ra)  Amb bm difficulty with speech but quite sharp mentally/ walks with walker due to cp   HEENT : Oropharynx  clear/ edentulous        NECK :  without  apparent JVD/ palpable Nodes/TM    LUNGS: no acc  muscle use,  Nl contour chest which is clear to A and P bilaterally without cough on insp or exp maneuvers   CV:  RRR  no s3 or murmur or increase in P2, and no edema   ABD:  soft and nontender with nl inspiratory excursion in the supine position. No bruits or organomegaly appreciated   MS:  ext warm without deformities Or obvious joint restrictions  calf tenderness, cyanosis or clubbing    SKIN: warm and dry without lesions    NEURO:  alert, approp, nl sensorium with  no motor or cerebellar deficits apparent.    Labs ordered/ reviewed:      Chemistry      Component Value Date/Time   NA 143 08/21/2022 1108   K 4.2 08/21/2022 1108   CL 101 08/21/2022 1108   CO2 20 08/21/2022 1108   BUN 16 08/21/2022 1108   CREATININE 1.19 08/21/2022 1108      Component Value Date/Time   CALCIUM 9.8 08/21/2022 1108   ALKPHOS 75 05/27/2022 1331   AST 21 05/27/2022 1331   ALT 16 05/27/2022 1331   BILITOT 0.9 05/27/2022 1331        Lab Results  Component Value Date   WBC 6.8 08/21/2022   HGB 12.7 (L) 08/21/2022   HCT 38.3 08/21/2022   MCV 80 08/21/2022   PLT 328 08/21/2022       EOS                                                              0.1                                    08/21/2022   Lab Results  Component Value Date   DDIMER 0.28 08/21/2022      Lab Results  Component Value Date   TSH 0.542 08/21/2022     BNP  08/21/2022  = 31     Lab Results  Component Value Date   ESRSEDRATE 25 08/21/2022       CXR PA and Lateral:   08/21/2022 :    I personally reviewed images and agree with radiology impression as follows:    No acute abnormalities. Scoliosis.  Assessment   DOE (dyspnea on exertion) Onset ? 2020 ?  P quit smoking in 1985 - 08/21/2022   Walked on RA  x  3  lap(s) =  approx 450  ft  @ fast pace despite walker, stopped due to end of study with lowest 02 sats 99% and no sob    Symptoms are markedly disproportionate to objective findings and not clear to  what extent this is actually a pulmonary  problem but pt does appear to have difficult to sort out respiratory symptoms of unknown origin for which  DDX  = almost all start with A and  include Adherence, Ace Inhibitors, Acid Reflux, Active Sinus Disease, Alpha 1 Antitripsin deficiency, Anxiety masquerading as Airways dz,  ABPA,  Allergy(esp in young), Aspiration (esp in elderly), Adverse effects of meds,  Active smoking or Vaping, A bunch of PE's/clot burden (a few small clots can't cause this syndrome unless there is already severe underlying pulm or vascular dz with poor reserve),  Anemia or thyroid disorder, plus two Bs  = Bronchiectasis and Beta blocker use..and one C= CHF    ? Allergies/.asthma  note lack of variability, noct symptoms, Eos so unlikely   ? Adverse drug effects (amiodarone)  ESR nl / none of the usual suspects listed  ? Anxiety/deconditioning  > usually at the bottom of this list of usual suspects but should be much higher on this pt's based on H and P   may interfere with adherence and also interpretation of response or lack thereof to symptom management which can be quite subjective.  Rec: >>>> Regular sub max ex working up to at least 30 min daily    ?  A bunch of PEs (has hx and not on rx)  > D dimer nl - while a normal  or high normal value (seen commonly in the elderly or chronically ill)  may miss small peripheral pe, the clot burden with sob is moderately high and the d dimer  has a very high neg pred value if used in this setting.    ? CHF/ afib hx but appears to be in sinus today> bnp nl / cxr ok   No pulmonary f/u needed based on today's eval, see prn   Each maintenance medication was reviewed in detail including emphasizing most importantly the difference between maintenance and prns and under what circumstances the prns are to be triggered using an action plan format where appropriate.  Total time for H and P, chart review, counseling,  directly observing portions  of ambulatory 02 saturation study/ and generating customized AVS unique to this office visit / same day charting = 39 min                    Sandrea Hughs, MD 08/21/2022

## 2022-08-21 NOTE — Assessment & Plan Note (Addendum)
Onset ? 2020 ?  P quit smoking in 1985 - 08/21/2022   Walked on RA  x  3  lap(s) =  approx 450  ft  @ fast pace despite walker, stopped due to end of study with lowest 02 sats 99% and no sob    Symptoms are markedly disproportionate to objective findings and not clear to what extent this is actually a pulmonary  problem but pt does appear to have difficult to sort out respiratory symptoms of unknown origin for which  DDX  = almost all start with A and  include Adherence, Ace Inhibitors, Acid Reflux, Active Sinus Disease, Alpha 1 Antitripsin deficiency, Anxiety masquerading as Airways dz,  ABPA,  Allergy(esp in young), Aspiration (esp in elderly), Adverse effects of meds,  Active smoking or Vaping, A bunch of PE's/clot burden (a few small clots can't cause this syndrome unless there is already severe underlying pulm or vascular dz with poor reserve),  Anemia or thyroid disorder, plus two Bs  = Bronchiectasis and Beta blocker use..and one C= CHF    ? Allergies/.asthma  note lack of variability, noct symptoms, Eos so unlikely   ? Adverse drug effects (amiodarone)  ESR nl / none of the usual suspects listed  ? Anxiety/deconditioning  > usually at the bottom of this list of usual suspects but should be much higher on this pt's based on H and P   may interfere with adherence and also interpretation of response or lack thereof to symptom management which can be quite subjective.  Rec: >>>> Regular sub max ex working up to at least 30 min daily    ?  A bunch of PEs (has hx and not on rx)  > D dimer nl - while a normal  or high normal value (seen commonly in the elderly or chronically ill)  may miss small peripheral pe, the clot burden with sob is moderately high and the d dimer  has a very high neg pred value if used in this setting.    ? CHF/ afib hx but appears to be in sinus today> bnp nl / cxr ok   No pulmonary f/u needed based on today's eval, see prn   Each maintenance medication was reviewed in  detail including emphasizing most importantly the difference between maintenance and prns and under what circumstances the prns are to be triggered using an action plan format where appropriate.  Total time for H and P, chart review, counseling,  directly observing portions of ambulatory 02 saturation study/ and generating customized AVS unique to this office visit / same day charting = 39 min

## 2022-08-24 LAB — CBC WITH DIFFERENTIAL/PLATELET
Basophils Absolute: 0 10*3/uL (ref 0.0–0.2)
Basos: 0 %
EOS (ABSOLUTE): 0.1 10*3/uL (ref 0.0–0.4)
Eos: 2 %
Hematocrit: 38.3 % (ref 37.5–51.0)
Hemoglobin: 12.7 g/dL — ABNORMAL LOW (ref 13.0–17.7)
Immature Grans (Abs): 0 10*3/uL (ref 0.0–0.1)
Immature Granulocytes: 0 %
Lymphocytes Absolute: 0.9 10*3/uL (ref 0.7–3.1)
Lymphs: 13 %
MCH: 26.5 pg — ABNORMAL LOW (ref 26.6–33.0)
MCHC: 33.2 g/dL (ref 31.5–35.7)
MCV: 80 fL (ref 79–97)
Monocytes Absolute: 0.9 10*3/uL (ref 0.1–0.9)
Monocytes: 14 %
Neutrophils Absolute: 4.8 10*3/uL (ref 1.4–7.0)
Neutrophils: 71 %
Platelets: 328 10*3/uL (ref 150–450)
RBC: 4.8 x10E6/uL (ref 4.14–5.80)
RDW: 19.4 % — ABNORMAL HIGH (ref 11.6–15.4)
WBC: 6.8 10*3/uL (ref 3.4–10.8)

## 2022-08-24 LAB — BASIC METABOLIC PANEL
BUN/Creatinine Ratio: 13 (ref 10–24)
BUN: 16 mg/dL (ref 8–27)
CO2: 20 mmol/L (ref 20–29)
Calcium: 9.8 mg/dL (ref 8.6–10.2)
Chloride: 101 mmol/L (ref 96–106)
Creatinine, Ser: 1.19 mg/dL (ref 0.76–1.27)
Glucose: 106 mg/dL — ABNORMAL HIGH (ref 70–99)
Potassium: 4.2 mmol/L (ref 3.5–5.2)
Sodium: 143 mmol/L (ref 134–144)
eGFR: 65 mL/min/{1.73_m2} (ref >59)

## 2022-08-24 LAB — D-DIMER, QUANTITATIVE: D-DIMER: 0.28 mg/L FEU (ref 0.00–0.49)

## 2022-08-24 LAB — BRAIN NATRIURETIC PEPTIDE: BNP: 30.5 pg/mL (ref 0.0–100.0)

## 2022-08-24 LAB — SEDIMENTATION RATE: Sed Rate: 25 mm/hr (ref 0–30)

## 2022-08-24 LAB — TSH: TSH: 0.542 u[IU]/mL (ref 0.450–4.500)

## 2022-08-27 ENCOUNTER — Encounter: Payer: Self-pay | Admitting: Internal Medicine

## 2022-10-15 ENCOUNTER — Encounter: Payer: Self-pay | Admitting: Gastroenterology

## 2022-10-15 ENCOUNTER — Telehealth (INDEPENDENT_AMBULATORY_CARE_PROVIDER_SITE_OTHER): Payer: Self-pay | Admitting: Internal Medicine

## 2022-10-15 ENCOUNTER — Ambulatory Visit (INDEPENDENT_AMBULATORY_CARE_PROVIDER_SITE_OTHER): Payer: Medicare Other | Admitting: Gastroenterology

## 2022-10-15 VITALS — BP 117/69 | HR 69 | Temp 97.1°F | Ht 68.0 in | Wt 161.2 lb

## 2022-10-15 DIAGNOSIS — D509 Iron deficiency anemia, unspecified: Secondary | ICD-10-CM | POA: Diagnosis not present

## 2022-10-15 NOTE — Progress Notes (Signed)
Gastroenterology Office Note     Primary Care Physician:  Liana Gerold, MD  Primary Gastroenterologist: Dr. Abbey Chatters    Chief Complaint   Chief Complaint  Patient presents with   Anemia    Patient here today after being referred by Dr. Theador Hawthorne his nephrologist due to CKD 2 and Anemia. Patient has seen bright red blood in his stools in the past,but not currently. Patient says he gets shortness of breath at times,but denies any dizziness, shortness of breath or chest pain.      History of Present Illness   Mario [name] is a 72 y.o. 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 EGD Dec 2022 with one gastric polyp s/p resection and retrieval, normal duodenum. Colonoscopy unable to be completed due to poor prep   He is here with a community paramedic, who accompanies him to appointments and assists with meds at home. He was last seen a year ago to arrange colonoscopy, but he was lost to follow-up.   He states he did not finish the morning prep as he was concerned about "making a mess" in the transportation vehicle. He is willing to do this now.   Hgb 12.7 in Jan 2024. Iron low normal at 56, iron sats 16. Feritin low at 19. Gets SOB with walking. No SOB at rest. He has seen rectal bleeding in the past but not recently.     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 per patient   CARDIOVERSION  2021   COLONOSCOPY WITH PROPOFOL N/A 07/21/2021   poor prep   ESOPHAGOGASTRODUODENOSCOPY (EGD) WITH PROPOFOL N/A  07/21/2021   one gastric polyp s/p resection and retrieval, normal duodenum   FOOT SURGERY     none     POLYPECTOMY  07/21/2021   Procedure: POLYPECTOMY;  Surgeon: Eloise Harman, DO;  Location: [facility];  Service: Endoscopy;;   POSTERIOR CERVICAL FUSION/FORAMINOTOMY N/A 04/22/2022   Procedure: POSTERIOR CERVICAL FUSION  WITH LATERAL MASS FIXATION CERVICAL TWO-THREE, CERVICAL THREE-FOUR, CERVICAL FOUR-FIVE;  Surgeon: Vallarie Mare, MD;  Location: [facility];  Service: Neurosurgery;  Laterality: N/A;  POSTERIOR CERVICAL FUSION  WITH LATERAL MASS FIXATION CERVICAL TWO-THREE, CERVICAL THREE-FOUR, CERVICAL FOUR-FIVE    Current Outpatient Medications  Medication Sig Dispense Refill   acetaminophen (TYLENOL) 325 MG tablet Take 650 mg by mouth every 6 (six) hours as needed for moderate pain.     amiodarone (PACERONE) 200 MG tablet Take 100 mg by mouth daily.     amLODipine (NORVASC) 10 MG tablet Take 10 mg by mouth daily.     calcitRIOL (ROCALTROL) 0.25 MCG capsule Take 0.25 mcg by mouth every Monday, Wednesday, and Friday.     ELIQUIS 5 MG TABS tablet Take 5 mg by mouth 2 (two) times daily.     finasteride (PROSCAR) 5 MG tablet Take 5 mg by mouth daily.     furosemide (LASIX) 20 MG tablet Take 20 mg  by mouth daily.     metoprolol succinate (TOPROL-XL) 25 MG 24 hr tablet Take 25 mg by mouth daily.     pantoprazole (PROTONIX) 40 MG tablet Take 40 mg by mouth daily.     tamsulosin (FLOMAX) 0.4 MG CAPS capsule Take 0.4 mg by mouth daily.     valsartan (DIOVAN) 40 MG tablet Take 20 mg by mouth daily.     No current facility-administered medications for this visit.    Allergies as of 10/15/2022   (No Known Allergies)    Family History  Problem Relation Age of Onset   Cancer Sister        unknown type   Colon cancer Neg Hx    Colon polyps Neg Hx     Social History   Socioeconomic History   Marital status: Single    Spouse name: Not on file   Number of children: Not on file    Years of education: Not on file   Highest education level: Not on file  Occupational History   Not on file  Tobacco Use   Smoking status: Former    Types: Cigarettes   Smokeless tobacco: Former    Types: Nurse, children's Use: Never used  Substance and Sexual Activity   Alcohol use: Not Currently   Drug use: Not Currently   Sexual activity: Not on file  Other Topics Concern   Not on file  Social History Narrative   Not on file   Social Determinants of Health   Financial Resource Strain: Not on file  Food Insecurity: Not on file  Transportation Needs: Not on file  Physical Activity: Not on file  Stress: Not on file  Social Connections: Not on file  Intimate Partner Violence: Not on file     Review of Systems   Gen: Denies any fever, chills, fatigue, weight loss, lack of appetite.  CV: Denies chest pain, heart palpitations, peripheral edema, syncope.  Resp: Denies shortness of breath at rest or with exertion. Denies wheezing or cough.  GI: Denies dysphagia or odynophagia. Denies jaundice, hematemesis, fecal incontinence. GU : Denies urinary burning, urinary frequency, urinary hesitancy MS: Denies joint pain, muscle weakness, cramps, or limitation of movement.  Derm: Denies rash, itching, dry skin Psych: Denies depression, anxiety, memory loss, and confusion Heme: Denies bruising, bleeding, and enlarged lymph nodes.   Physical Exam   BP 117/69 (BP Location: Right Arm, Patient Position: Sitting, Cuff Size: Small)   Pulse 69   Temp (!) 97.1 F (36.2 C) (Temporal)   Ht 5\' 8"  (1.727 m)   Wt 161 lb 3.2 oz (73.1 kg)   BMI 24.51 kg/m  General:   Alert and oriented. Pleasant and cooperative. Well-nourished and well-developed.  Head:  Normocephalic and atraumatic. Eyes:  Without icterus Abdomen:  +BS, soft, non-tender and non-distended. No HSM noted. No guarding or rebound. No masses appreciated.  Rectal:  Deferred  Msk:  Symmetrical without gross deformities.  Normal posture. Extremities:  Without edema. Neurologic:  Alert and  oriented x4;  grossly normal neurologically. Skin:  Intact without significant lesions or rashes. Psych:  Alert and cooperative. Normal mood and affect.   Assessment   Mario [name] is a 72 y.o. 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 EGD Dec 2022 with one gastric polyp s/p resection and retrieval, normal duodenum. Colonoscopy unable to be completed due to poor prep   Anemia: Hgb 12.7 in Jan 2024,  iron low normal at 56, iron sats 16, ferritin low at 19. Followed by nephrology. Needs colonoscopy as has not had one completed.  Last colonoscopy did not complete entire prep due to concern for bowel incontinence in transportation that morning. His community paramedic is with him and will help assist with this. He is well aware of the need to complete the entire prep     PLAN    Proceed with colonoscopy by Dr. Abbey Chatters  in near future: the risks, benefits, and alternatives have been discussed with the patient in detail. The patient states understanding and desires to proceed.  2 days of clear liquids prior Hold Eliquis X 2 days prior   [name], PhD, [facility] Gastroenterology

## 2022-10-15 NOTE — Patient Instructions (Signed)
We are arranging a colonoscopy in the near future!  Please stop Eliquis 2 days before.  Make sure to drink the whole prep. I know you can do it!  I enjoyed seeing you again today! At our first visit, I mentioned how I value our relationship and want to provide genuine, compassionate, and quality care. You may receive a survey regarding your visit with me, and I welcome your feedback! Thanks so much for taking the time to complete this. I look forward to seeing you again.   [name], PhD, ANP-BC [facility] Gastroenterology

## 2022-10-15 NOTE — Telephone Encounter (Signed)
Attempted to contact Levada Dy to schedule colonoscopy with Dr.Carver ASA 3 for pt. Left message to return to call.  2 days clears Prep all day before No short volume

## 2022-10-21 NOTE — Telephone Encounter (Signed)
LMOVM to call back to schedule for Levada Dy at [phone]

## 2022-10-22 ENCOUNTER — Other Ambulatory Visit: Payer: Self-pay | Admitting: *Deleted

## 2022-10-22 ENCOUNTER — Encounter: Payer: Self-pay | Admitting: *Deleted

## 2022-10-22 MED ORDER — PEG 3350-KCL-NA BICARB-NACL 420 G PO SOLR: 4000.0000 mL | Freq: Once | ORAL | 0 refills | Status: AC

## 2022-10-22 NOTE — Telephone Encounter (Signed)
Pt has been scheduled for 11/19/22. Instructions mailed and prep sent to the pharmacy

## 2022-10-23 ENCOUNTER — Encounter: Payer: Self-pay | Admitting: *Deleted

## 2022-11-06 ENCOUNTER — Telehealth: Payer: Self-pay | Admitting: *Deleted

## 2022-11-06 NOTE — Telephone Encounter (Signed)
Instructions returned in mail  Mail box is full. Unable to leave message

## 2022-11-09 ENCOUNTER — Encounter: Payer: Self-pay | Admitting: *Deleted

## 2022-11-09 NOTE — Telephone Encounter (Signed)
LMOVM to return call at home # 2nd phone number unable to leave message due to mail box being full

## 2022-11-11 NOTE — Telephone Encounter (Signed)
Sent instructions and  pre-op date via MyChart

## 2022-11-16 NOTE — Patient Instructions (Addendum)
Mario [name]  11/16/2022     @PREFPERIOPPHARMACY @   Your procedure is scheduled on  11/19/2022.   Report to [facility] at  0830 A.M.   Call this number if you have problems the morning of surgery:  [phone]  If you experience any cold or flu symptoms such as cough, fever, chills, shortness of breath, etc. between now and your scheduled surgery, please notify us at the above number.   Remember:  Follow the diet and prep instructions given to you by the office.       Your last dose of eliquis should be on 11/16/2022.    Take these medicines the morning of surgery with A SIP OF WATER            amiodarone, amlodipine, proscar, metoprolol, pantoprazole, flomax.     Do not wear jewelry, make-up or nail polish.  Do not wear lotions, powders, or perfumes, or deodorant.  Do not shave 48 hours prior to surgery.  Men may shave face and neck.  Do not bring valuables to the hospital.  [facility] is not responsible for any belongings or valuables.  Contacts, dentures or bridgework may not be worn into surgery.  Leave your suitcase in the car.  After surgery it may be brought to your room.  For patients admitted to the hospital, discharge time will be determined by your treatment team.  Patients discharged the day of surgery will not be allowed to drive home and must have someone with them for 24 hours.    Special instructions:         DO NOT smoke tobacco or vape for 24 hours before your procedure.   Please read over the following fact sheets that you were given. Anesthesia Post-op Instructions and Care and Recovery After Surgery      Colonoscopy, Adult, Care After The following information offers guidance on how to care for yourself after your procedure. Your health care provider may also give you more specific instructions. If you have problems or questions, contact your health care provider. What can I expect after the procedure? After the procedure, it is  common to have: A small amount of blood in your stool for 24 hours after the procedure. Some gas. Mild cramping or bloating of your abdomen. Follow these instructions at home: Eating and drinking  Drink enough fluid to keep your urine pale yellow. Follow instructions from your health care provider about eating or drinking restrictions. Resume your normal diet as told by your health care provider. Avoid heavy or fried foods that are hard to digest. Activity Rest as told by your health care provider. Avoid sitting for a long time without moving. Get up to take short walks every 1-2 hours. This is important to improve blood flow and breathing. Ask for help if you feel weak or unsteady. Return to your normal activities as told by your health care provider. Ask your health care provider what activities are safe for you. Managing cramping and bloating  Try walking around when you have cramps or feel bloated. If directed, apply heat to your abdomen as told by your health care provider. Use the heat source that your health care provider recommends, such as a moist heat pack or a heating pad. Place a towel between your skin and the heat source. Leave the heat on for 20-30 minutes. Remove the heat if your skin turns bright red. This is especially important if you are unable to  feel pain, heat, or cold. You have a greater risk of getting burned. General instructions If you were given a sedative during the procedure, it can affect you for several hours. Do not drive or operate machinery until your health care provider says that it is safe. For the first 24 hours after the procedure: Do not sign important documents. Do not drink alcohol. Do your regular daily activities at a slower pace than normal. Eat soft foods that are easy to digest. Take over-the-counter and prescription medicines only as told by your health care provider. Keep all follow-up visits. This is important. Contact a health care  provider if: You have blood in your stool 2-3 days after the procedure. Get help right away if: You have more than a small spotting of blood in your stool. You have large blood clots in your stool. You have swelling of your abdomen. You have nausea or vomiting. You have a fever. You have increasing pain in your abdomen that is not relieved with medicine. These symptoms may be an emergency. Get help right away. Call 911. Do not wait to see if the symptoms will go away. Do not drive yourself to the hospital. Summary After the procedure, it is common to have a small amount of blood in your stool. You may also have mild cramping and bloating of your abdomen. If you were given a sedative during the procedure, it can affect you for several hours. Do not drive or operate machinery until your health care provider says that it is safe. Get help right away if you have a lot of blood in your stool, nausea or vomiting, a fever, or increased pain in your abdomen. This information is not intended to replace advice given to you by your health care provider. Make sure you discuss any questions you have with your health care provider. Document Revised: 03/26/2021 Document Reviewed: 03/26/2021 Elsevier Patient Education  [place] After The following information offers guidance on how to care for yourself after your procedure. Your health care provider may also give you more specific instructions. If you have problems or questions, contact your health care provider. What can I expect after the procedure? After the procedure, it is common to have: Tiredness. Little or no memory about what happened during or after the procedure. Impaired judgment when it comes to making decisions. Nausea or vomiting. Some trouble with balance. Follow these instructions at home: For the time period you were told by your health care provider:  Rest. Do not participate in activities  where you could fall or become injured. Do not drive or use machinery. Do not drink alcohol. Do not take sleeping pills or medicines that cause drowsiness. Do not make important decisions or sign legal documents. Do not take care of children on your own. Medicines Take over-the-counter and prescription medicines only as told by your health care provider. If you were prescribed antibiotics, take them as told by your health care provider. Do not stop using the antibiotic even if you start to feel better. Eating and drinking Follow instructions from your health care provider about what you may eat and drink. Drink enough fluid to keep your urine pale yellow. If you vomit: Drink clear fluids slowly and in small amounts as you are able. Clear fluids include water, ice chips, low-calorie sports drinks, and fruit juice that has water added to it (diluted fruit juice). Eat light and bland foods in small amounts as you are able.  These foods include bananas, applesauce, rice, lean meats, toast, and crackers. General instructions  Have a responsible adult stay with you for the time you are told. It is important to have someone help care for you until you are awake and alert. If you have sleep apnea, surgery and some medicines can increase your risk for breathing problems. Follow instructions from your health care provider about wearing your sleep device: When you are sleeping. This includes during daytime naps. While taking prescription pain medicines, sleeping medicines, or medicines that make you drowsy. Do not use any products that contain nicotine or tobacco. These products include cigarettes, chewing tobacco, and vaping devices, such as e-cigarettes. If you need help quitting, ask your health care provider. Contact a health care provider if: You feel nauseous or vomit every time you eat or drink. You feel light-headed. You are still sleepy or having trouble with balance after 24 hours. You get a  rash. You have a fever. You have redness or swelling around the IV site. Get help right away if: You have trouble breathing. You have new confusion after you get home. These symptoms may be an emergency. Get help right away. Call 911. Do not wait to see if the symptoms will go away. Do not drive yourself to the hospital. This information is not intended to replace advice given to you by your health care provider. Make sure you discuss any questions you have with your health care provider. Document Revised: 12/29/2021 Document Reviewed: 12/29/2021 Elsevier Patient Education  [place].

## 2022-11-17 ENCOUNTER — Encounter (HOSPITAL_COMMUNITY)
Admission: RE | Admit: 2022-11-17 | Discharge: 2022-11-17 | Disposition: A | Discharge status: Home / Self Care | Payer: Medicare Other | Source: Ambulatory Visit | Attending: Internal Medicine | Admitting: Internal Medicine

## 2022-11-17 ENCOUNTER — Encounter (HOSPITAL_COMMUNITY): Payer: Self-pay

## 2022-11-17 VITALS — BP 145/87 | HR 70 | Temp 97.7°F | Resp 18 | Ht 68.0 in | Wt 161.2 lb

## 2022-11-17 DIAGNOSIS — I4891 Unspecified atrial fibrillation: Secondary | ICD-10-CM | POA: Insufficient documentation

## 2022-11-17 DIAGNOSIS — Z01818 Encounter for other preprocedural examination: Secondary | ICD-10-CM | POA: Insufficient documentation

## 2022-11-17 DIAGNOSIS — D500 Iron deficiency anemia secondary to blood loss (chronic): Secondary | ICD-10-CM | POA: Insufficient documentation

## 2022-11-17 DIAGNOSIS — N182 Chronic kidney disease, stage 2 (mild): Secondary | ICD-10-CM | POA: Diagnosis not present

## 2022-11-17 LAB — CBC WITH DIFFERENTIAL/PLATELET
Abs Immature Granulocytes: 0.06 10*3/uL (ref 0.00–0.07)
Basophils Absolute: 0 10*3/uL (ref 0.0–0.1)
Basophils Relative: 1 %
Eosinophils Absolute: 0.2 10*3/uL (ref 0.0–0.5)
Eosinophils Relative: 3 %
HCT: 42.7 % (ref 39.0–52.0)
Hemoglobin: 14.2 g/dL (ref 13.0–17.0)
Immature Granulocytes: 1 %
Lymphocytes Relative: 21 %
Lymphs Abs: 1.1 10*3/uL (ref 0.7–4.0)
MCH: 28 pg (ref 26.0–34.0)
MCHC: 33.3 g/dL (ref 30.0–36.0)
MCV: 84.1 fL (ref 80.0–100.0)
Monocytes Absolute: 0.8 10*3/uL (ref 0.1–1.0)
Monocytes Relative: 15 %
Neutro Abs: 3.1 10*3/uL (ref 1.7–7.7)
Neutrophils Relative %: 59 %
Platelets: 233 10*3/uL (ref 150–400)
RBC: 5.08 MIL/uL (ref 4.22–5.81)
RDW: 16.4 % — ABNORMAL HIGH (ref 11.5–15.5)
WBC: 5.3 10*3/uL (ref 4.0–10.5)
nRBC: 0 % (ref 0.0–0.2)

## 2022-11-17 LAB — BASIC METABOLIC PANEL
Anion gap: 9 (ref 5–15)
BUN: 18 mg/dL (ref 8–23)
CO2: 25 mmol/L (ref 22–32)
Calcium: 9.3 mg/dL (ref 8.9–10.3)
Chloride: 106 mmol/L (ref 98–111)
Creatinine, Ser: 1.32 mg/dL — ABNORMAL HIGH (ref 0.61–1.24)
GFR, Estimated: 57 mL/min — ABNORMAL LOW (ref >60)
Glucose, Bld: 77 mg/dL (ref 70–99)
Potassium: 3.6 mmol/L (ref 3.5–5.1)
Sodium: 140 mmol/L (ref 135–145)

## 2022-11-19 ENCOUNTER — Encounter (HOSPITAL_COMMUNITY): Payer: Self-pay

## 2022-11-19 ENCOUNTER — Ambulatory Visit (HOSPITAL_COMMUNITY): Payer: Medicare Other | Admitting: Anesthesiology

## 2022-11-19 ENCOUNTER — Ambulatory Visit (HOSPITAL_BASED_OUTPATIENT_CLINIC_OR_DEPARTMENT_OTHER): Payer: Medicare Other | Admitting: Anesthesiology

## 2022-11-19 ENCOUNTER — Encounter (HOSPITAL_COMMUNITY)
Admission: RE | Disposition: A | Discharge status: Home / Self Care | Payer: Self-pay | Source: Ambulatory Visit | Attending: Internal Medicine

## 2022-11-19 ENCOUNTER — Ambulatory Visit (HOSPITAL_COMMUNITY)
Admission: RE | Admit: 2022-11-19 | Discharge: 2022-11-19 | Disposition: A | Discharge status: Home / Self Care | Payer: Medicare Other | Source: Ambulatory Visit | Attending: Internal Medicine | Admitting: Internal Medicine

## 2022-11-19 DIAGNOSIS — D509 Iron deficiency anemia, unspecified: Secondary | ICD-10-CM

## 2022-11-19 DIAGNOSIS — K649 Unspecified hemorrhoids: Secondary | ICD-10-CM

## 2022-11-19 DIAGNOSIS — D120 Benign neoplasm of cecum: Secondary | ICD-10-CM | POA: Diagnosis not present

## 2022-11-19 DIAGNOSIS — I130 Hypertensive heart and chronic kidney disease with heart failure and stage 1 through stage 4 chronic kidney disease, or unspecified chronic kidney disease: Secondary | ICD-10-CM | POA: Insufficient documentation

## 2022-11-19 DIAGNOSIS — I509 Heart failure, unspecified: Secondary | ICD-10-CM | POA: Insufficient documentation

## 2022-11-19 DIAGNOSIS — K648 Other hemorrhoids: Secondary | ICD-10-CM | POA: Diagnosis not present

## 2022-11-19 DIAGNOSIS — K635 Polyp of colon: Secondary | ICD-10-CM | POA: Diagnosis not present

## 2022-11-19 DIAGNOSIS — N189 Chronic kidney disease, unspecified: Secondary | ICD-10-CM | POA: Diagnosis not present

## 2022-11-19 DIAGNOSIS — Z87891 Personal history of nicotine dependence: Secondary | ICD-10-CM

## 2022-11-19 DIAGNOSIS — I110 Hypertensive heart disease with heart failure: Secondary | ICD-10-CM | POA: Diagnosis not present

## 2022-11-19 HISTORY — PX: BIOPSY: SHX5522

## 2022-11-19 HISTORY — PX: COLONOSCOPY WITH PROPOFOL: SHX5780

## 2022-11-19 SURGERY — COLONOSCOPY WITH PROPOFOL
Anesthesia: General

## 2022-11-19 MED ORDER — LIDOCAINE HCL (CARDIAC) PF 100 MG/5ML IV SOSY
PREFILLED_SYRINGE | INTRAVENOUS | Status: DC | PRN
  Administered 2022-11-19: 50 mg via INTRAVENOUS

## 2022-11-19 MED ORDER — PROPOFOL 10 MG/ML IV BOLUS
INTRAVENOUS | Status: DC | PRN
  Administered 2022-11-19: 80 mg via INTRAVENOUS

## 2022-11-19 MED ORDER — PROPOFOL 500 MG/50ML IV EMUL
INTRAVENOUS | Status: DC | PRN
  Administered 2022-11-19: 150 ug/kg/min via INTRAVENOUS

## 2022-11-19 MED ORDER — LACTATED RINGERS IV SOLN: INTRAVENOUS | Status: DC

## 2022-11-19 NOTE — Anesthesia Preprocedure Evaluation (Signed)
Anesthesia Evaluation  Patient identified by MRN, date of birth, ID band Patient awake    Reviewed: Allergy & Precautions, H&P , NPO status , Patient's Chart, lab work & pertinent test results, reviewed documented beta blocker date and time   Airway Mallampati: II  TM Distance: >3 FB Neck ROM: full    Dental no notable dental hx.    Pulmonary neg pulmonary ROS, shortness of breath, former smoker   Pulmonary exam normal breath sounds clear to auscultation       Cardiovascular Exercise Tolerance: Good hypertension, +CHF and + DOE  negative cardio ROS + dysrhythmias  Rhythm:regular Rate:Normal     Neuro/Psych negative neurological ROS  negative psych ROS   GI/Hepatic negative GI ROS, Neg liver ROS,,,  Endo/Other  negative endocrine ROS    Renal/GU Renal diseasenegative Renal ROS  negative genitourinary   Musculoskeletal   Abdominal   Peds  Hematology negative hematology ROS (+) Blood dyscrasia, anemia   Anesthesia Other Findings   Reproductive/Obstetrics negative OB ROS                             Anesthesia Physical Anesthesia Plan  ASA: 3  Anesthesia Plan: General   Post-op Pain Management:    Induction:   PONV Risk Score and Plan: Propofol infusion  Airway Management Planned:   Additional Equipment:   Intra-op Plan:   Post-operative Plan:   Informed Consent: I have reviewed the patients History and Physical, chart, labs and discussed the procedure including the risks, benefits and alternatives for the proposed anesthesia with the patient or authorized representative who has indicated his/her understanding and acceptance.     Dental Advisory Given  Plan Discussed with: CRNA  Anesthesia Plan Comments:        Anesthesia Quick Evaluation

## 2022-11-19 NOTE — H&P (Signed)
Primary Care Physician:  [facility]ologist:  Dr. Abbey Chatters  Pre-Procedure History & Physical: HPI:  Mario [name] is a 72 y.o. 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   CARDIOVERSION  2021   COLONOSCOPY WITH PROPOFOL N/A 07/21/2021   poor prep   ESOPHAGOGASTRODUODENOSCOPY (EGD) WITH PROPOFOL N/A 07/21/2021   one gastric polyp s/p resection and retrieval, normal duodenum   FOOT SURGERY     none     POLYPECTOMY  07/21/2021   Procedure: POLYPECTOMY;  Surgeon: Eloise Harman, DO;  Location: [facility];  Service: Endoscopy;;   POSTERIOR CERVICAL FUSION/FORAMINOTOMY N/A 04/22/2022   Procedure: POSTERIOR CERVICAL FUSION  WITH LATERAL MASS FIXATION CERVICAL TWO-THREE, CERVICAL THREE-FOUR, CERVICAL FOUR-FIVE;  Surgeon: Vallarie Mare, MD;  Location: [place];  Service: Neurosurgery;  Laterality: N/A;  POSTERIOR CERVICAL FUSION  WITH LATERAL MASS FIXATION CERVICAL TWO-THREE, CERVICAL THREE-FOUR, CERVICAL FOUR-FIVE    Prior to Admission medications   Medication Sig Start Date End Date Taking? Authorizing Provider  acetaminophen (TYLENOL) 325 MG tablet Take 650 mg by mouth every 6 (six) hours as needed for moderate pain.   Yes [provider]  amiodarone (PACERONE) 200 MG tablet Take 100 mg by mouth daily. 05/07/21  Yes [provider]  amLODipine (NORVASC) 10 MG tablet Take 10 mg by mouth daily.   Yes [provider]  calcitRIOL (ROCALTROL) 0.25 MCG capsule Take 0.25  mcg by mouth every Monday, Wednesday, and Friday.   Yes [provider]  ELIQUIS 5 MG TABS tablet Take 5 mg by mouth 2 (two) times daily. 05/07/21  Yes [provider]  finasteride (PROSCAR) 5 MG tablet Take 5 mg by mouth daily. 07/01/21  Yes [provider]  metoprolol succinate (TOPROL-XL) 25 MG 24 hr tablet Take 25 mg by mouth daily. 06/28/20  Yes [provider]  pantoprazole (PROTONIX) 40 MG tablet Take 40 mg by mouth daily. 06/28/20  Yes [provider]  tamsulosin (FLOMAX) 0.4 MG CAPS capsule Take 0.4 mg by mouth daily. 07/01/21  Yes [provider]  valsartan (DIOVAN) 40 MG tablet Take 20 mg by mouth daily.   Yes [provider]  furosemide (LASIX) 20 MG tablet Take 20 mg by mouth daily. 06/28/20   [provider]    Allergies as of 10/22/2022   (No Known Allergies)    Family History  Problem Relation Age of Onset   Cancer Sister        unknown type   Colon cancer Neg Hx    Colon polyps Neg Hx     Social History   Socioeconomic History   Marital status: Single    Spouse name: Not on file   Number of children: Not on file   Years of education: Not on file   Highest education level: Not on file  Occupational History   Not on file  Tobacco Use  Smoking status: Former    Types: Cigarettes   Smokeless tobacco: Former    Types: Nurse, children's Use: Never used  Substance and Sexual Activity   Alcohol use: Not Currently   Drug use: Not Currently   Sexual activity: Not on file  Other Topics Concern   Not on file  Social History Narrative   Not on file   Social Determinants of Health   Financial Resource Strain: Not on file  Food Insecurity: Not on file  Transportation Needs: Not on file  Physical Activity: Not on file  Stress: Not on file  Social Connections: Not on file  Intimate Partner Violence: Not on file    Review of Systems: See HPI, otherwise negative ROS  Physical  Exam: Vital signs in last 24 hours: Temp:  [98 F (36.7 C)] 98 F (36.7 C) (04/04 1018) Pulse Rate:  [57] 57 (04/04 1018) Resp:  [20] 20 (04/04 1018) BP: (140)/(86) 140/86 (04/04 1018) SpO2:  [100 %] 100 % (04/04 1018)   General:   Alert,  Well-developed, well-nourished, pleasant and cooperative in NAD Head:  Normocephalic and atraumatic. Eyes:  Sclera clear, no icterus.   Conjunctiva pink. Ears:  Normal auditory acuity. Nose:  No deformity, discharge,  or lesions. Msk:  Symmetrical without gross deformities. Normal posture. Extremities:  Without clubbing or edema. Neurologic:  Alert and  oriented x4;  grossly normal neurologically. Skin:  Intact without significant lesions or rashes. Psych:  Alert and cooperative. Normal mood and affect.  Impression/Plan: [name] is here for a colonoscopy to be performed for anemia  The risks of the procedure including infection, bleed, or perforation as well as benefits, limitations, alternatives and imponderables have been reviewed with the patient. Questions have been answered. All parties agreeable.

## 2022-11-19 NOTE — Discharge Instructions (Signed)
  Colonoscopy Discharge Instructions  Read the instructions outlined below and refer to this sheet in the next few weeks. These discharge instructions provide you with general information on caring for yourself after you leave the hospital. Your doctor may also give you specific instructions. While your treatment has been planned according to the most current medical practices available, unavoidable complications occasionally occur.   ACTIVITY You may resume your regular activity, but move at a slower pace for the next 24 hours.  Take frequent rest periods for the next 24 hours.  Walking will help get rid of the air and reduce the bloated feeling in your belly (abdomen).  No driving for 24 hours (because of the medicine (anesthesia) used during the test).   Do not sign any important legal documents or operate any machinery for 24 hours (because of the anesthesia used during the test).  NUTRITION Drink plenty of fluids.  You may resume your normal diet as instructed by your doctor.  Begin with a light meal and progress to your normal diet. Heavy or fried foods are harder to digest and may make you feel sick to your stomach (nauseated).  Avoid alcoholic beverages for 24 hours or as instructed.  MEDICATIONS You may resume your normal medications unless your doctor tells you otherwise.  WHAT YOU CAN EXPECT TODAY Some feelings of bloating in the abdomen.  Passage of more gas than usual.  Spotting of blood in your stool or on the toilet paper.  IF YOU HAD POLYPS REMOVED DURING THE COLONOSCOPY: No aspirin products for 7 days or as instructed.  No alcohol for 7 days or as instructed.  Eat a soft diet for the next 24 hours.  FINDING OUT THE RESULTS OF YOUR TEST Not all test results are available during your visit. If your test results are not back during the visit, make an appointment with your caregiver to find out the results. Do not assume everything is normal if you have not heard from your  caregiver or the medical facility. It is important for you to follow up on all of your test results.  SEEK IMMEDIATE MEDICAL ATTENTION IF: You have more than a spotting of blood in your stool.  Your belly is swollen (abdominal distention).  You are nauseated or vomiting.  You have a temperature over 101.  You have abdominal pain or discomfort that is severe or gets worse throughout the day.   Your colonoscopy revealed 1 polyp(s) which I removed successfully. Await pathology results, my office will contact you. I recommend repeating colonoscopy in 7-10 years for surveillance purposes.   Follow up with GI in 3-4 months  I hope you have a great rest of your week!  Elon Alas. Abbey Chatters, D.O. Gastroenterology and Hepatology [facility]

## 2022-11-19 NOTE — Progress Notes (Signed)
while Post op nurse was with a surgery pt, Mr. Spack got up and walked out without letting anymore know.  Pt nowhere to be found.   RN called Salli Quarry, found pt to be at home.  Pt verbalized understanding after procedure to not get up without assistance and to let staff know when cab driver arrived to be escorted out with Tamsen Roers, caregiver. Pt walked out.  Tamsen Roers was made aware of situation and was told that pt was back home.

## 2022-11-19 NOTE — Transfer of Care (Signed)
Immediate Anesthesia Transfer of Care Note  Patient: Mario [name]  Procedure(s) Performed: COLONOSCOPY WITH PROPOFOL BIOPSY  Patient Location: Short Stay  Anesthesia Type:General  Level of Consciousness: drowsy  Airway & Oxygen Therapy: Patient Spontanous Breathing  Post-op Assessment: Report given to RN and Post -op Vital signs reviewed and stable  Post vital signs: Reviewed and stable  Last Vitals:  Vitals Value Taken Time  BP 83/43 11/19/22 1203  Temp    Pulse 55 11/19/22 1205  Resp 15 11/19/22 1205  SpO2 98 % 11/19/22 1205  Vitals shown include unvalidated device data.  Last Pain:  Vitals:   11/19/22 1137  TempSrc:   PainSc: 0-No pain         Complications: No notable events documented.

## 2022-11-19 NOTE — Op Note (Signed)
[facility] Patient Name: Mario [name] Procedure Date: 11/19/2022 11:26 AM MRN: [id number] Date of Birth: July 01, 1951 Attending MD: Elon Alas. Edgar Frisk, [id number] CSN: [id number] Age: 72 Admit Type: Outpatient Procedure:                Colonoscopy Indications:              Iron deficiency anemia Providers:                Elon Alas. Abbey Chatters, DO, Janeece Riggers, RN, Ladoris Gene                            Technician, Technician Referring MD:              Medicines:                See the Anesthesia note for documentation of the                            administered medications Complications:            No immediate complications. Estimated Blood Loss:     Estimated blood loss was minimal. Procedure:                Pre-Anesthesia Assessment:                           - The anesthesia plan was to use monitored                            anesthesia care (MAC).                           After obtaining informed consent, the colonoscope                            was passed under direct vision. Throughout the                            procedure, the patient's blood pressure, pulse, and                            oxygen saturations were monitored continuously. The                            [id number] scope was introduced through                            the anus and advanced to the the cecum, identified                            by appendiceal orifice and ileocecal valve. The                            colonoscopy was performed without difficulty. The                            patient tolerated the procedure well. The  quality                            of the bowel preparation was evaluated using the                            BBPS [facility] Bowel Preparation Scale) with scores                            of: Right Colon = 3, Transverse Colon = 3 and Left                            Colon = 3 (entire mucosa seen well with no residual                            staining,  small fragments of stool or opaque                            liquid). The total BBPS score equals 9. Scope In: 11:41:53 AM Scope Out: 11:58:52 AM Scope Withdrawal Time: 0 hours 11 minutes 53 seconds  Total Procedure Duration: 0 hours 16 minutes 59 seconds  Findings:      Non-bleeding internal hemorrhoids were found during endoscopy.      A 2 mm polyp was found in the cecum. The polyp was sessile. The polyp       was removed with a cold biopsy forceps. Resection and retrieval were       complete.      The exam was otherwise without abnormality. Impression:               - Non-bleeding internal hemorrhoids.                           - One 2 mm polyp in the cecum, removed with a cold                            biopsy forceps. Resected and retrieved.                           - The examination was otherwise normal. Moderate Sedation:      Per Anesthesia Care Recommendation:           - Patient has a contact number available for                            emergencies. The signs and symptoms of potential                            delayed complications were discussed with the                            patient. Return to normal activities tomorrow.                            Written discharge instructions were provided to the  patient.                           - Resume previous diet.                           - Continue present medications.                           - Await pathology results.                           - Repeat colonoscopy in 7-10 years for surveillance.                           - Return to GI clinic in 3 months. Procedure Code(s):        --- Professional ---                           [phone], Colonoscopy, flexible; with biopsy, single                            or multiple Diagnosis Code(s):        --- Professional ---                           D12.0, Benign neoplasm of cecum                           K64.8, Other hemorrhoids                            D50.9, Iron deficiency anemia, unspecified CPT copyright 2022 American Medical Association. All rights reserved. The codes documented in this report are preliminary and upon coder review may  be revised to meet current compliance requirements. Elon Alas. Abbey Chatters, DO [place] Abbey Chatters, DO 11/19/2022 12:02:03 PM This report has been signed electronically. Number of Addenda: 0

## 2022-11-20 NOTE — Anesthesia Postprocedure Evaluation (Signed)
Anesthesia Post Note  Patient: Mario [name]  Procedure(s) Performed: COLONOSCOPY WITH PROPOFOL BIOPSY  Patient location during evaluation: Phase II Anesthesia Type: General Level of consciousness: awake Pain management: pain level controlled Vital Signs Assessment: post-procedure vital signs reviewed and stable Respiratory status: spontaneous breathing and respiratory function stable Cardiovascular status: blood pressure returned to baseline and stable Postop Assessment: no headache and no apparent nausea or vomiting Anesthetic complications: no Comments: Late entry   No notable events documented.   Last Vitals:  Vitals:   11/19/22 1202 11/19/22 1209  BP: (!) 83/43 105/75  Pulse: (!) 53   Resp: 18   Temp: 36.4 C   SpO2: 99%     Last Pain:  Vitals:   11/19/22 1202  TempSrc: Axillary  PainSc:                  [place]ell [place]

## 2022-11-23 LAB — SURGICAL PATHOLOGY

## 2022-11-26 ENCOUNTER — Encounter (HOSPITAL_COMMUNITY): Payer: Self-pay | Admitting: Internal Medicine

## 2022-12-08 IMAGING — MR MR CERVICAL SPINE W/O CM
5 series · 39 of 48 positions shown · non-contrast
Comparison: None.

CLINICAL DATA: Neck pain with bilateral upper extremity
radiculopathy for 2 months

EXAM:
MRI CERVICAL SPINE WITHOUT CONTRAST
TECHNIQUE: Multiplanar, multisequence MR imaging of the cervical spine was
performed. No intravenous contrast was administered.

[Series 5: T2 · sagittal · 3.0mm · 0.69mm/px · 6 of 15 slices shown (1 of 2)]
[im 1/15]
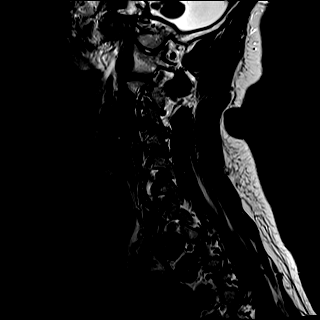
[im 3/15]
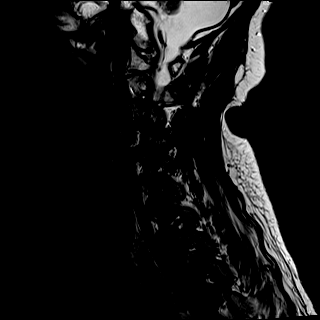
[im 6/15]
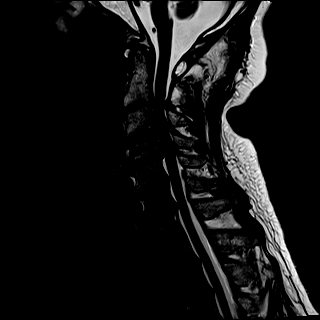
[im 9/15]
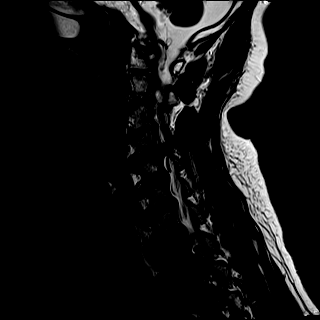
[im 12/15]
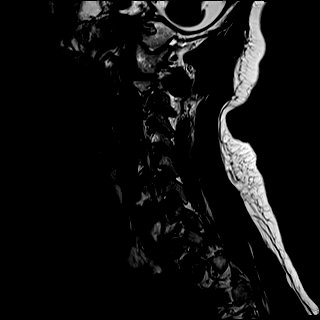
[im 15/15]
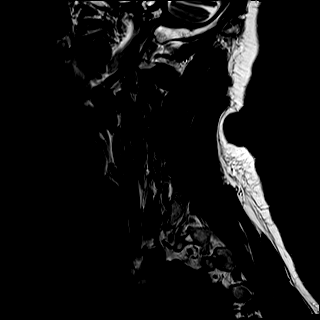

[Series 6: T1 · sagittal · 3.0mm · 0.86mm/px · 6 of 15 slices shown]
[im 1/15]
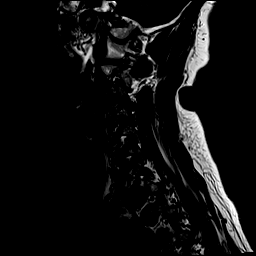
[im 3/15]
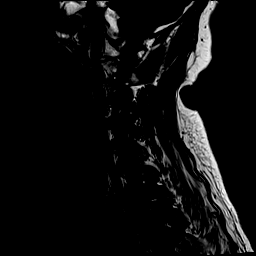
[im 6/15]
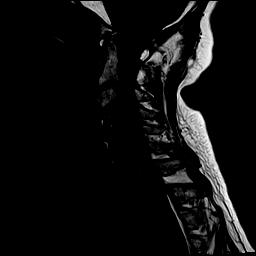
[im 9/15]
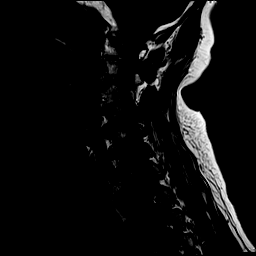
[im 12/15]
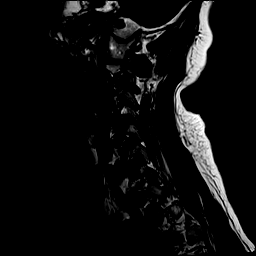
[im 15/15]
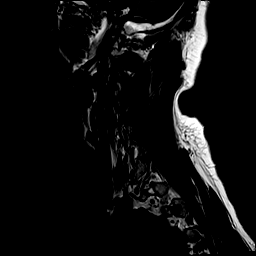

[Series 7: STIR · sagittal · 3.0mm · 0.69mm/px · 6 of 15 slices shown]
[im 1/15]
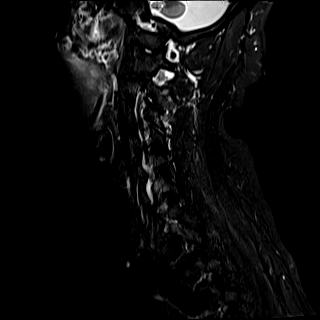
[im 3/15]
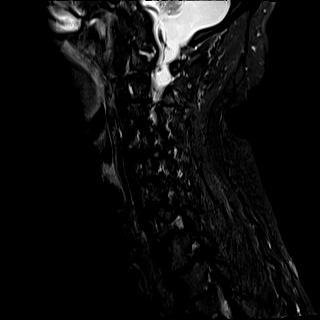
[im 6/15]
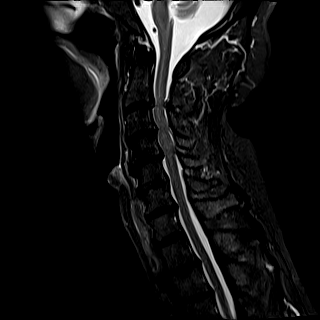
[im 9/15]
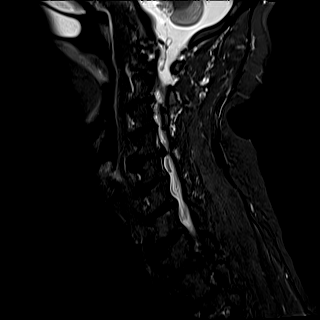
[im 12/15]
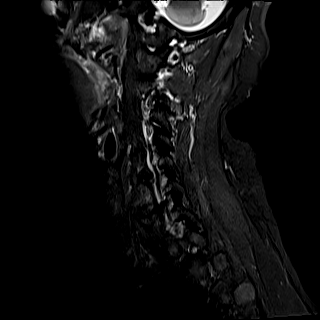
[im 15/15]
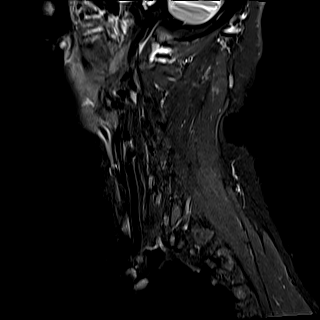

[Series 8: T2 · axial · 3.0mm · 0.70mm/px · z∈[-98,+16]mm · 13 of 35 slices shown (2 of 2)]
[im 1/35]
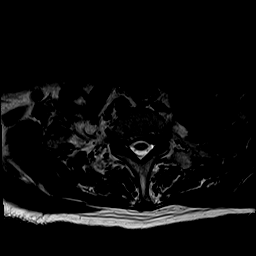
[im 3/35]
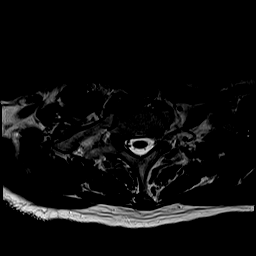
[im 5/35]
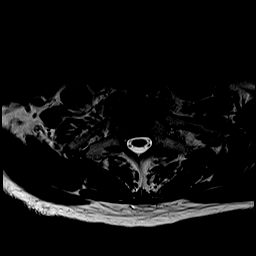
[im 8/35]
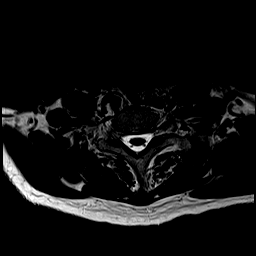
[im 10/35]
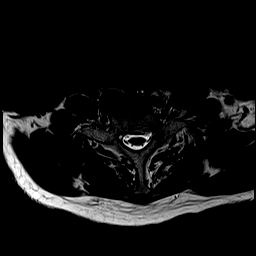
[im 13/35]
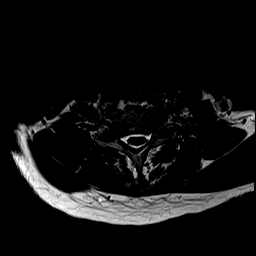
[im 15/35]
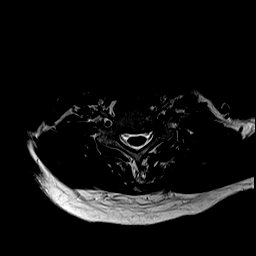
[im 18/35]
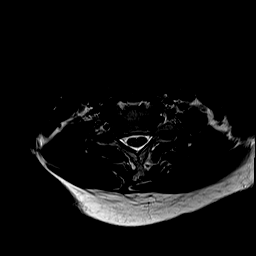
[im 20/35]
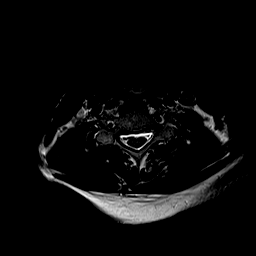
[im 22/35]
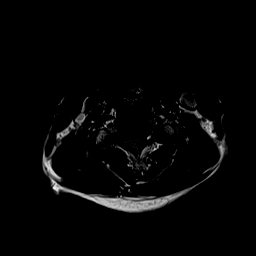
[im 25/35]
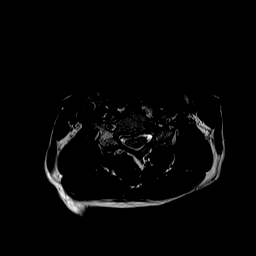
[im 30/35]
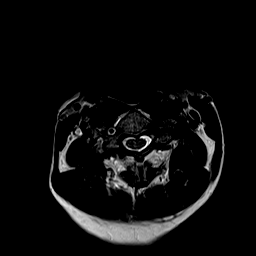
[im 35/35]
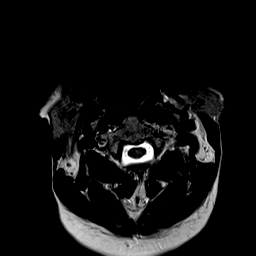

[Series 9: GRE · axial · 3.0mm · 0.35mm/px · z∈[-98,+16]mm · 8 of 35 slices shown]
[im 1/35]
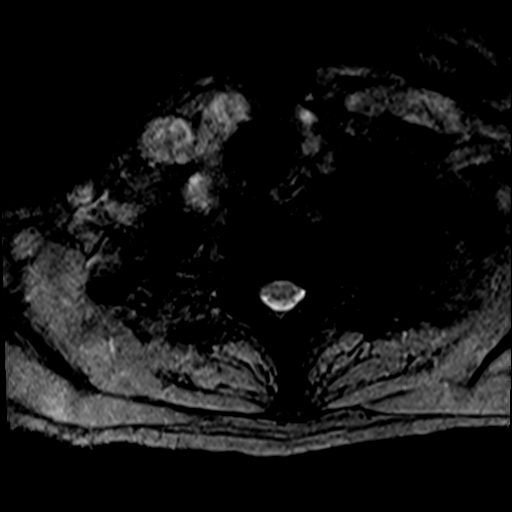
[im 5/35]
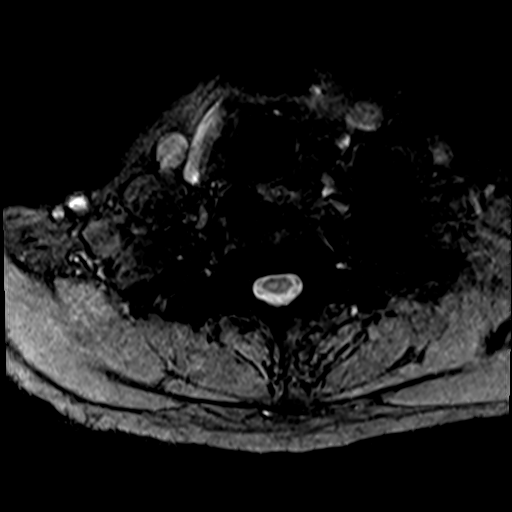
[im 10/35]
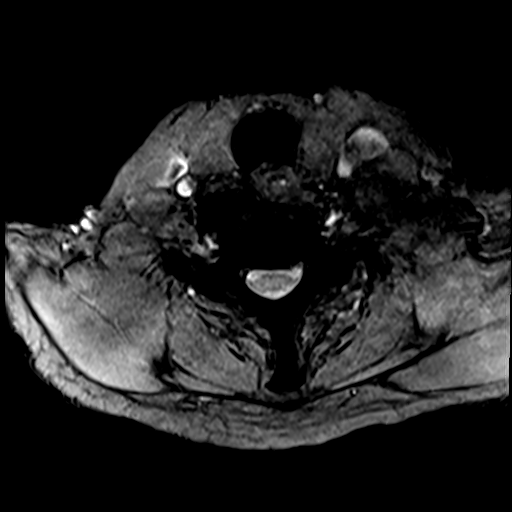
[im 15/35]
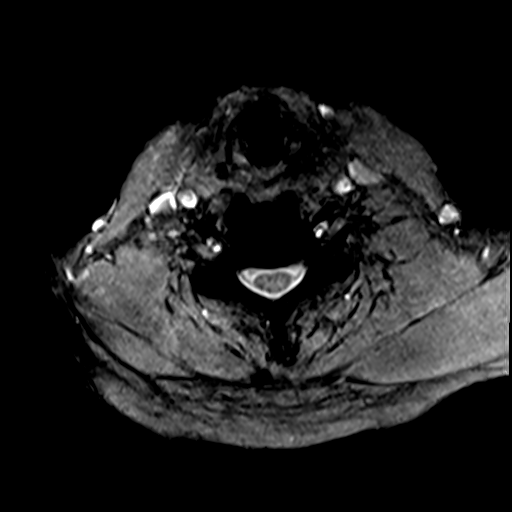
[im 20/35]
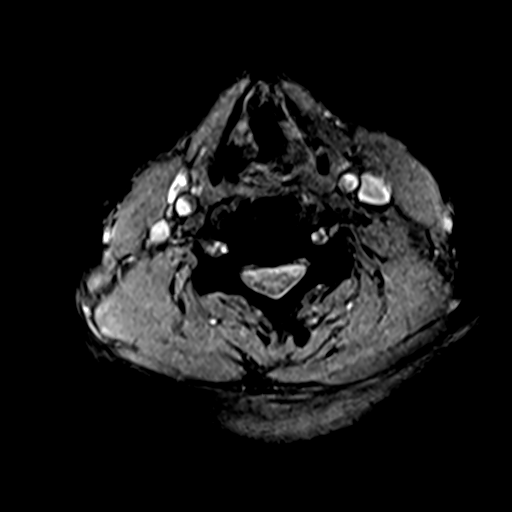
[im 25/35]
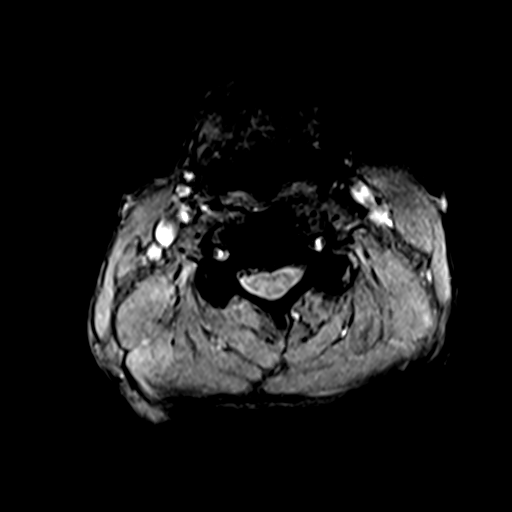
[im 30/35]
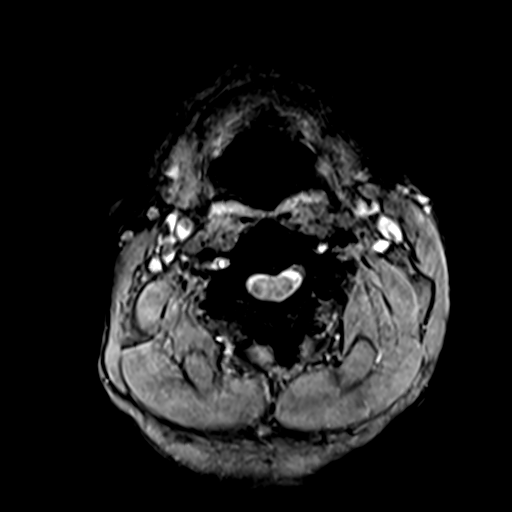
[im 35/35]
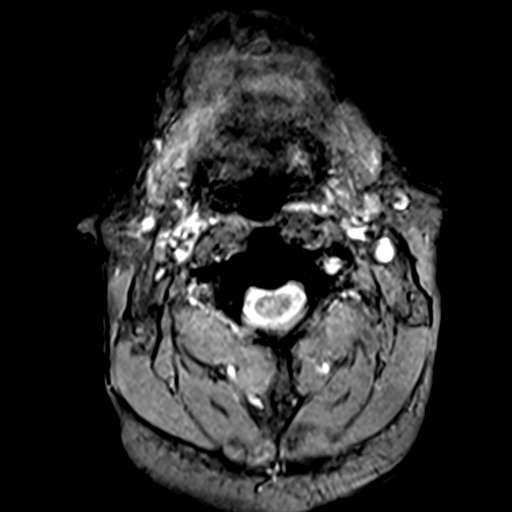

[39 of 48 positions shown; findings below may reference images not displayed]

FINDINGS: Alignment: Slight retrolisthesis at C2-3 and C4-5.

Vertebrae: No fracture, evidence of discitis, or bone lesion.

Cord: Normal signal and morphology.

Posterior Fossa, vertebral arteries, paraspinal tissues: Negative.

Disc levels:

C2-C3: Disc osteophyte complex with bilateral uncovertebral spurring
and mild right facet arthropathy resulting in moderate canal
stenosis with moderate bilateral foraminal stenosis.

C3-C4: Disc osteophyte complex with left greater than right
uncovertebral spurring and mild bilateral facet arthropathy. There
is severe canal stenosis with moderate bilateral foraminal stenosis.

C4-C5: Disc osteophyte complex with right greater than left
uncovertebral spurring and minimal facet arthropathy resulting in
severe canal stenosis with severe right and moderate-severe left
foraminal stenosis.

C5-C6: Disc osteophyte complex with bilateral uncovertebral spurring
and minimal facet arthropathy resulting in mild canal stenosis with
moderate-severe bilateral foraminal stenosis.

C6-C7: Disc osteophyte complex with bilateral uncovertebral spurring
resulting in mild canal stenosis with moderate-severe bilateral
foraminal stenosis.

C7-T1: Unremarkable disc.  No foraminal or canal stenosis.
IMPRESSION: 1. Advanced multilevel cervical spondylosis resulting in severe
canal stenosis at C3-4 and C4-5. Moderate C2-3 and mild C5-6 and
C6-7 canal stenosis.
2. Moderate-severe bilateral foraminal stenosis from C3-4 through
C6-7.

## 2022-12-17 ENCOUNTER — Encounter: Payer: Self-pay | Admitting: Gastroenterology

## 2022-12-17 ENCOUNTER — Ambulatory Visit (INDEPENDENT_AMBULATORY_CARE_PROVIDER_SITE_OTHER): Payer: Medicare Other | Admitting: Gastroenterology

## 2022-12-17 VITALS — BP 157/86 | HR 69 | Temp 97.7°F | Ht 68.0 in | Wt 168.8 lb

## 2022-12-17 DIAGNOSIS — D509 Iron deficiency anemia, unspecified: Secondary | ICD-10-CM | POA: Diagnosis not present

## 2022-12-17 NOTE — Progress Notes (Signed)
Gastroenterology Office Note     Primary Care Physician:  [facility]  Primary Gastroenterologist: Dr. Marletta Lor    Chief Complaint   Chief Complaint  Patient presents with   Follow-up    Follow up on anemia     History of Present Illness   Mario [name] is a 72 y.o. 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 EGD Dec 2022 with one gastric polyp s/p resection and retrieval, normal duodenum. He had difficulty completing colonoscopy prep earlier but has since done this in interim from last visit.   Colonoscopy April 2024 with non-bleeding internal hemorrhoids, 2 mm cecal polyp. Tubular adenoma  Hgb 12.7 in Jan 2024. Iron low normal at 56, iron sats 16. Feritin low at 19. April 2024: iron 70, ferritin 33, Hgb 13.6    No rectal bleeding. Feels tired at times, some improvement. No abdominal pain, N/V, changes in bowel habits, constipation, diarrhea, overt GI bleeding, GERD, dysphagia, unexplained weight loss, lack of appetite, unexplained weight gain.     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 Tubular adenoma   ESOPHAGOGASTRODUODENOSCOPY (EGD) WITH  PROPOFOL N/A 07/21/2021   one gastric polyp s/p resection and retrieval, normal duodenum   FOOT SURGERY     none     POLYPECTOMY  07/21/2021   Procedure: POLYPECTOMY;  Surgeon: Lanelle Bal, DO;  Location: [facility];  Service: Endoscopy;;   POSTERIOR CERVICAL FUSION/FORAMINOTOMY N/A 04/22/2022   Procedure: POSTERIOR CERVICAL FUSION  WITH LATERAL MASS FIXATION CERVICAL TWO-THREE, CERVICAL THREE-FOUR, CERVICAL FOUR-FIVE;  Surgeon: Bedelia Person, MD;  Location: [facility] OR;  Service: Neurosurgery;  Laterality: N/A;  POSTERIOR CERVICAL FUSION  WITH LATERAL MASS FIXATION CERVICAL TWO-THREE, CERVICAL THREE-FOUR, CERVICAL FOUR-FIVE    Current Outpatient Medications  Medication Sig Dispense Refill   acetaminophen (TYLENOL) 325 MG tablet Take 650 mg by mouth every 6 (six) hours as needed for moderate pain.     amiodarone (PACERONE) 200 MG tablet Take 100 mg by mouth daily.     amLODipine (NORVASC) 10 MG tablet Take 10 mg by mouth daily.     calcitRIOL (ROCALTROL) 0.25 MCG capsule Take 0.25 mcg by mouth every Monday, Wednesday, and Friday.     ELIQUIS 5 MG TABS tablet Take 5 mg by mouth 2 (two) times daily.     finasteride (PROSCAR) 5 MG tablet Take 5 mg by mouth daily.     furosemide (LASIX) 20 MG tablet Take 20 mg by mouth daily.     metoprolol succinate (TOPROL-XL) 25 MG 24 hr tablet Take 25  mg by mouth daily.     pantoprazole (PROTONIX) 40 MG tablet Take 40 mg by mouth daily.     tamsulosin (FLOMAX) 0.4 MG CAPS capsule Take 0.4 mg by mouth daily.     valsartan (DIOVAN) 40 MG tablet Take 20 mg by mouth daily.     No current facility-administered medications for this visit.    Allergies as of 12/17/2022   (No Known Allergies)    Family History  Problem Relation Age of Onset   Cancer Sister        unknown type   Colon cancer Neg Hx    Colon polyps Neg Hx     Social History   Socioeconomic History   Marital status: Single    Spouse name: Not on file   Number of children: Not  on file   Years of education: Not on file   Highest education level: Not on file  Occupational History   Not on file  Tobacco Use   Smoking status: Former    Types: Cigarettes   Smokeless tobacco: Former    Types: Associate Professor Use: Never used  Substance and Sexual Activity   Alcohol use: Not Currently   Drug use: Not Currently   Sexual activity: Not on file  Other Topics Concern   Not on file  Social History Narrative   Not on file   Social Determinants of Health   Financial Resource Strain: Not on file  Food Insecurity: Not on file  Transportation Needs: Not on file  Physical Activity: Not on file  Stress: Not on file  Social Connections: Not on file  Intimate Partner Violence: Not on file     Review of Systems   Gen: Denies any fever, chills, fatigue, weight loss, lack of appetite.  CV: Denies chest pain, heart palpitations, peripheral edema, syncope.  Resp: Denies shortness of breath at rest or with exertion. Denies wheezing or cough.  GI: Denies dysphagia or odynophagia. Denies jaundice, hematemesis, fecal incontinence. GU : Denies urinary burning, urinary frequency, urinary hesitancy MS: Denies joint pain, muscle weakness, cramps, or limitation of movement.  Derm: Denies rash, itching, dry skin Psych: Denies depression, anxiety, memory loss, and confusion Heme: Denies bruising, bleeding, and enlarged lymph nodes.   Physical Exam   BP (!) 157/86   Pulse 69   Temp 97.7 F (36.5 C)   Ht 5\' 8"  (1.727 m)   Wt 168 lb 12.8 oz (76.6 kg)   BMI 25.67 kg/m  General:   Alert and oriented. Pleasant and cooperative. Well-nourished and well-developed.  Head:  Normocephalic and atraumatic. Eyes:  Without icterus Abdomen:  +BS, soft, non-tender and non-distended. No HSM noted. No guarding or rebound. No masses appreciated.  Rectal:  Deferred  Msk:  Symmetrical without gross deformities. Normal posture. Extremities:  Without edema. Neurologic:  Alert and   oriented x4;  grossly normal neurologically. Skin:  Intact without significant lesions or rashes. Psych:  Alert and cooperative. Normal mood and affect.   Assessment   Mario [name] is a 72 y.o. 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     EGD on file from 2022 and colonoscopy recently completed with tubular adenoma. His Hgb has improved to normal, and ferritin has improved from low at 19 earlier this year to now 33. He has no overt GI bleeding. Remains on Eliquis.  Community paramedic present with him today. We discussed pursuing capsule study  vs watching/waiting. We will repeat CBC and iron studies in 3 months. If any drops again, will need capsule study.  PLAN    CBC, iron studies in 3 months. Labs given to community paramedic Capsule study if recurrent iron deficiency   Gelene Mink, PhD, ANP-BC [facility]

## 2022-12-17 NOTE — Patient Instructions (Signed)
Please have blood work done in 3 months! We will see if anything additional needs to be done.  Please call with any blood in stools, belly pain, constipation, diarrhea, nausea, vomiting.   I enjoyed seeing you again today! At our first visit, I mentioned how I value our relationship and want to provide genuine, compassionate, and quality care. You may receive a survey regarding your visit with me, and I welcome your feedback! Thanks so much for taking the time to complete this. I look forward to seeing you again.   Gelene Mink, PhD, ANP-BC [facility]

## 2024-04-07 ENCOUNTER — Encounter: Payer: Self-pay | Admitting: Radiology

## 2024-06-19 ENCOUNTER — Encounter: Payer: Self-pay | Admitting: Radiology
